# Patient Record
Sex: Female | Born: 1935 | Race: White | Hispanic: No | Smoking: Former smoker
Health system: Southern US, Community
[De-identification: ages and names within clinical notes are randomized; demographics above are authoritative.]

## PROBLEM LIST (undated history)

## (undated) DIAGNOSIS — G459 Transient cerebral ischemic attack, unspecified: Secondary | ICD-10-CM

## (undated) DIAGNOSIS — I493 Ventricular premature depolarization: Secondary | ICD-10-CM

## (undated) DIAGNOSIS — K449 Diaphragmatic hernia without obstruction or gangrene: Secondary | ICD-10-CM

## (undated) DIAGNOSIS — K259 Gastric ulcer, unspecified as acute or chronic, without hemorrhage or perforation: Secondary | ICD-10-CM

## (undated) DIAGNOSIS — N2889 Other specified disorders of kidney and ureter: Secondary | ICD-10-CM

## (undated) DIAGNOSIS — M549 Dorsalgia, unspecified: Secondary | ICD-10-CM

## (undated) DIAGNOSIS — I8500 Esophageal varices without bleeding: Secondary | ICD-10-CM

## (undated) DIAGNOSIS — R918 Other nonspecific abnormal finding of lung field: Secondary | ICD-10-CM

## (undated) DIAGNOSIS — I10 Essential (primary) hypertension: Secondary | ICD-10-CM

## (undated) DIAGNOSIS — C801 Malignant (primary) neoplasm, unspecified: Secondary | ICD-10-CM

## (undated) DIAGNOSIS — E785 Hyperlipidemia, unspecified: Secondary | ICD-10-CM

## (undated) DIAGNOSIS — I2510 Atherosclerotic heart disease of native coronary artery without angina pectoris: Secondary | ICD-10-CM

## (undated) DIAGNOSIS — R9430 Abnormal result of cardiovascular function study, unspecified: Secondary | ICD-10-CM

## (undated) DIAGNOSIS — N814 Uterovaginal prolapse, unspecified: Secondary | ICD-10-CM

## (undated) DIAGNOSIS — R002 Palpitations: Secondary | ICD-10-CM

## (undated) DIAGNOSIS — D649 Anemia, unspecified: Secondary | ICD-10-CM

## (undated) HISTORY — PX: OTHER SURGICAL HISTORY: SHX169

## (undated) HISTORY — DX: Dorsalgia, unspecified: M54.9

## (undated) HISTORY — DX: Abnormal result of cardiovascular function study, unspecified: R94.30

## (undated) HISTORY — PX: BACK SURGERY: SHX140

## (undated) HISTORY — DX: Hyperlipidemia, unspecified: E78.5

## (undated) HISTORY — DX: Ventricular premature depolarization: I49.3

## (undated) HISTORY — PX: TONSILLECTOMY: SUR1361

## (undated) HISTORY — PX: ABDOMINAL HYSTERECTOMY: SHX81

## (undated) HISTORY — DX: Anemia, unspecified: D64.9

## (undated) HISTORY — DX: Atherosclerotic heart disease of native coronary artery without angina pectoris: I25.10

## (undated) HISTORY — DX: Palpitations: R00.2

## (undated) HISTORY — DX: Transient cerebral ischemic attack, unspecified: G45.9

---

## 1999-04-01 ENCOUNTER — Encounter: Payer: Self-pay | Admitting: Neurosurgery

## 1999-04-03 ENCOUNTER — Encounter: Payer: Self-pay | Admitting: Neurosurgery

## 1999-04-03 ENCOUNTER — Inpatient Hospital Stay (HOSPITAL_COMMUNITY): Admission: RE | Admit: 1999-04-03 | Discharge: 1999-04-08 | Payer: Self-pay | Admitting: Neurosurgery

## 1999-07-08 ENCOUNTER — Encounter: Payer: Self-pay | Admitting: Neurosurgery

## 1999-07-08 ENCOUNTER — Encounter: Admission: RE | Admit: 1999-07-08 | Discharge: 1999-07-08 | Payer: Self-pay | Admitting: Neurosurgery

## 1999-10-23 ENCOUNTER — Encounter: Payer: Self-pay | Admitting: Neurosurgery

## 1999-10-23 ENCOUNTER — Ambulatory Visit (HOSPITAL_COMMUNITY): Admission: RE | Admit: 1999-10-23 | Discharge: 1999-10-23 | Payer: Self-pay | Admitting: Neurosurgery

## 1999-10-27 ENCOUNTER — Encounter: Admission: RE | Admit: 1999-10-27 | Discharge: 1999-10-27 | Payer: Self-pay | Admitting: Neurosurgery

## 1999-10-27 ENCOUNTER — Encounter: Payer: Self-pay | Admitting: Neurosurgery

## 2004-04-14 ENCOUNTER — Ambulatory Visit: Payer: Self-pay | Admitting: Internal Medicine

## 2004-04-17 ENCOUNTER — Ambulatory Visit: Payer: Self-pay | Admitting: Cardiology

## 2004-08-25 ENCOUNTER — Inpatient Hospital Stay (HOSPITAL_COMMUNITY): Admission: AD | Admit: 2004-08-25 | Discharge: 2004-08-26 | Payer: Self-pay | Admitting: Cardiology

## 2004-08-25 ENCOUNTER — Ambulatory Visit: Payer: Self-pay | Admitting: Cardiology

## 2004-08-26 ENCOUNTER — Ambulatory Visit: Payer: Self-pay | Admitting: Cardiology

## 2004-09-18 ENCOUNTER — Ambulatory Visit: Payer: Self-pay | Admitting: Cardiology

## 2004-12-15 ENCOUNTER — Ambulatory Visit: Payer: Self-pay | Admitting: Cardiology

## 2005-07-29 ENCOUNTER — Ambulatory Visit: Payer: Self-pay | Admitting: Cardiology

## 2006-03-03 ENCOUNTER — Ambulatory Visit: Payer: Self-pay | Admitting: Cardiology

## 2006-03-10 ENCOUNTER — Ambulatory Visit: Payer: Self-pay | Admitting: Cardiology

## 2006-03-11 ENCOUNTER — Ambulatory Visit: Payer: Self-pay | Admitting: Cardiology

## 2008-06-07 HISTORY — PX: ESOPHAGOGASTRODUODENOSCOPY: SHX1529

## 2009-01-07 HISTORY — PX: ESOPHAGOGASTRODUODENOSCOPY: SHX1529

## 2010-05-19 ENCOUNTER — Telehealth: Payer: Self-pay | Admitting: *Deleted

## 2010-05-27 NOTE — Progress Notes (Signed)
Summary: Funny Feeling in Arms  Phone Note Call from Patient Call back at [facility] Phone [phone]   Summary of Call: Pt states Friday night she had funny spell with arms. She states she doesn't know how to describe it. She states she got weak and had pain between shoulders/neck. She states had another spell on Sunday. She didn't want to go to ER b/c she's scared to go there. Notified pt that ER is most appropriate place for evaluation of these symptoms. Pt verbalized understanding.  Initial call taken by: Cyril Loosen, RN, BSN,  May 19, 2010 9:12 AM

## 2012-03-17 ENCOUNTER — Ambulatory Visit (INDEPENDENT_AMBULATORY_CARE_PROVIDER_SITE_OTHER): Payer: Self-pay | Admitting: Internal Medicine

## 2012-03-22 ENCOUNTER — Encounter (INDEPENDENT_AMBULATORY_CARE_PROVIDER_SITE_OTHER): Payer: Self-pay | Admitting: *Deleted

## 2012-03-22 ENCOUNTER — Ambulatory Visit (INDEPENDENT_AMBULATORY_CARE_PROVIDER_SITE_OTHER): Payer: Medicare Other | Admitting: Internal Medicine

## 2012-03-22 ENCOUNTER — Encounter (INDEPENDENT_AMBULATORY_CARE_PROVIDER_SITE_OTHER): Payer: Self-pay | Admitting: Internal Medicine

## 2012-03-22 ENCOUNTER — Other Ambulatory Visit (INDEPENDENT_AMBULATORY_CARE_PROVIDER_SITE_OTHER): Payer: Self-pay | Admitting: *Deleted

## 2012-03-22 VITALS — BP 134/70 | HR 60 | Temp 97.7°F | Ht 63.0 in | Wt 133.4 lb

## 2012-03-22 DIAGNOSIS — R1310 Dysphagia, unspecified: Secondary | ICD-10-CM

## 2012-03-22 DIAGNOSIS — K2270 Barrett's esophagus without dysplasia: Secondary | ICD-10-CM | POA: Insufficient documentation

## 2012-03-22 DIAGNOSIS — K920 Hematemesis: Secondary | ICD-10-CM | POA: Insufficient documentation

## 2012-03-22 DIAGNOSIS — D649 Anemia, unspecified: Secondary | ICD-10-CM

## 2012-03-22 NOTE — Progress Notes (Signed)
Subjective:     Patient ID: Dorothy Brooks, female   DOB: [birth year], 79 y.o.   MRN: [id number]  HPI Referred to our office by Dr. Sherryll Burger for dysphagia.  She has early satiety.  She feels like foods are lodging in her lower esophagus. Symptoms  X 2 yrs. Appetite is not good. She says she can go all day without eating.  There has not been any weight loss. She has vomited x 2 in the past 2 weeks. No abdominal pain.  She has a BM about every day. No melena or bright red rectal bleeding. Normal size. Granddaughter states that patient vomited a couple of weeks ago and it had blood in it. (coffee ground).  EGD 01/18/2009  by Dr. Karilyn Cota: found to have short segment Barrett's with low grade dysplasia on EGD of April of 2010. At that time she also had extensive esophageal ulceration felt to be due to heaving and retching resulting in upper GI bleed and a large hiatal hernia.  Esiophagitis completely healed. Short segment Barrett's. Large sliding hiatal hernia. Biopsy: Barrett's esophagus showing low grade dysplasia. 02/23/2012 H and H 9.7 and 31.9, MCV 81.  Review of Systems see hpi Current Outpatient Prescriptions  Medication Sig Dispense Refill  . ALPRAZolam (XANAX) 0.5 MG tablet Take 0.5 mg by mouth at bedtime as needed.      . citalopram (CELEXA) 40 MG tablet Take 40 mg by mouth daily.      . ferrous fumarate (HEMOCYTE - 106 MG FE) 325 (106 FE) MG TABS Take 1 tablet by mouth.      . OxyCODONE HCl ER (OXYCONTIN) 30 MG T12A Take by mouth.      . propranolol (INDERAL) 10 MG tablet Take 10 mg by mouth 3 (three) times daily.      Marland Kitchen rOPINIRole (REQUIP) 1 MG tablet Take 1 mg by mouth 3 (three) times daily.       Past Medical History  Diagnosis Date  . Back pain   . High cholesterol   . Anemia    Past Surgical History  Procedure Date  . Back surgery     x 3  . Complete hysterectomy     uterine cancer  . Tonsillectomy   . Left cataract surgery   . Bilateral knee replacement in 2006    Allergies    Allergen Reactions  . Talwin (Pentazocine)         Objective:   Physical Exam  Filed Vitals:   03/22/12 1013  BP: 134/70  Pulse: 60  Temp: 97.7 F (36.5 C)  Height: 5\' 3"  (1.6 m)  Weight: 133 lb 6.4 oz (60.51 kg)   Alert and oriented. Skin warm and dry. Oral mucosa is moist.   . Sclera anicteric, conjunctivae is pink. Thyroid not enlarged. No cervical lymphadenopathy. Lungs clear. Heart regular rate and rhythm.  Abdomen is soft. Bowel sounds are positive. No hepatomegaly. No abdominal masses felt. No tenderness.  No edema to lower extremities.  Stools brown and guaiac negative.     Assessment:   Dysphagia/Hematemesis: Stricture needs to be ruled out. PUD needs to be ruled out also given hx of vomiting coffee ground substance. Surveillance for Barrett's Anemia: PUD needs to be ruled out. Barrett's esophagus: In 2010 revealed low grade dysplasia.     Plan:    EGD/ED with Dr. Karilyn Cota. Patient has declined a colonscopy in the past. Continue Protonix.

## 2012-03-22 NOTE — Patient Instructions (Addendum)
EGD/ED with Dr. Rehman. 

## 2012-03-23 ENCOUNTER — Encounter (HOSPITAL_COMMUNITY): Payer: Self-pay | Admitting: Pharmacy Technician

## 2012-03-30 ENCOUNTER — Ambulatory Visit (HOSPITAL_COMMUNITY)
Admission: RE | Admit: 2012-03-30 | Discharge: 2012-03-30 | Disposition: A | Discharge status: Home / Self Care | Payer: Medicare Other | Source: Ambulatory Visit | Attending: Internal Medicine | Admitting: Internal Medicine

## 2012-03-30 ENCOUNTER — Encounter (HOSPITAL_COMMUNITY)
Admission: RE | Disposition: A | Discharge status: Home / Self Care | Payer: Self-pay | Source: Ambulatory Visit | Attending: Internal Medicine

## 2012-03-30 ENCOUNTER — Encounter (HOSPITAL_COMMUNITY): Payer: Self-pay | Admitting: *Deleted

## 2012-03-30 DIAGNOSIS — K2211 Ulcer of esophagus with bleeding: Secondary | ICD-10-CM

## 2012-03-30 DIAGNOSIS — R112 Nausea with vomiting, unspecified: Secondary | ICD-10-CM | POA: Insufficient documentation

## 2012-03-30 DIAGNOSIS — K449 Diaphragmatic hernia without obstruction or gangrene: Secondary | ICD-10-CM | POA: Insufficient documentation

## 2012-03-30 DIAGNOSIS — K2270 Barrett's esophagus without dysplasia: Secondary | ICD-10-CM

## 2012-03-30 DIAGNOSIS — K219 Gastro-esophageal reflux disease without esophagitis: Secondary | ICD-10-CM

## 2012-03-30 DIAGNOSIS — D649 Anemia, unspecified: Secondary | ICD-10-CM

## 2012-03-30 DIAGNOSIS — R1310 Dysphagia, unspecified: Secondary | ICD-10-CM

## 2012-03-30 DIAGNOSIS — K2970 Gastritis, unspecified, without bleeding: Secondary | ICD-10-CM | POA: Insufficient documentation

## 2012-03-30 HISTORY — DX: Gastric ulcer, unspecified as acute or chronic, without hemorrhage or perforation: K25.9

## 2012-03-30 HISTORY — DX: Malignant (primary) neoplasm, unspecified: C80.1

## 2012-03-30 HISTORY — PX: ESOPHAGOGASTRODUODENOSCOPY (EGD) WITH ESOPHAGEAL DILATION: SHX5812

## 2012-03-30 LAB — CBC
Hemoglobin: 9.8 g/dL — ABNORMAL LOW (ref 12.0–15.0)
MCH: 25.9 pg — ABNORMAL LOW (ref 26.0–34.0)
RBC: 3.78 MIL/uL — ABNORMAL LOW (ref 3.87–5.11)
WBC: 4.6 10*3/uL (ref 4.0–10.5)

## 2012-03-30 LAB — IRON AND TIBC
Iron: 47 ug/dL (ref 42–135)
Saturation Ratios: 13 % — ABNORMAL LOW (ref 20–55)
TIBC: 359 ug/dL (ref 250–470)
UIBC: 312 ug/dL (ref 125–400)

## 2012-03-30 LAB — FERRITIN: Ferritin: 14 ng/mL (ref 10–291)

## 2012-03-30 SURGERY — ESOPHAGOGASTRODUODENOSCOPY (EGD) WITH ESOPHAGEAL DILATION
Anesthesia: Moderate Sedation

## 2012-03-30 MED ORDER — STERILE WATER FOR IRRIGATION IR SOLN
Status: DC | PRN
  Administered 2012-03-30: 11:00:00

## 2012-03-30 MED ORDER — MEPERIDINE HCL 25 MG/ML IJ SOLN
INTRAMUSCULAR | Status: DC | PRN
  Administered 2012-03-30: 25 mg via INTRAVENOUS

## 2012-03-30 MED ORDER — RANITIDINE HCL 150 MG PO TABS: 150.0000 mg | ORAL_TABLET | Freq: Every day | ORAL | Status: DC

## 2012-03-30 MED ORDER — MIDAZOLAM HCL 5 MG/5ML IJ SOLN
INTRAMUSCULAR | Status: DC | PRN
  Administered 2012-03-30: 1 mg via INTRAVENOUS
  Administered 2012-03-30: 2 mg via INTRAVENOUS

## 2012-03-30 MED ORDER — BUTAMBEN-TETRACAINE-BENZOCAINE 2-2-14 % EX AERO
INHALATION_SPRAY | CUTANEOUS | Status: DC | PRN
  Administered 2012-03-30: 2 via TOPICAL

## 2012-03-30 MED ORDER — MIDAZOLAM HCL 5 MG/5ML IJ SOLN
INTRAMUSCULAR | Status: AC
  Filled 2012-03-30: qty 10

## 2012-03-30 MED ORDER — MEPERIDINE HCL 50 MG/ML IJ SOLN
INTRAMUSCULAR | Status: AC
  Filled 2012-03-30: qty 1

## 2012-03-30 MED ORDER — SODIUM CHLORIDE 0.45 % IV SOLN
INTRAVENOUS | Status: DC
  Administered 2012-03-30: 11:00:00 via INTRAVENOUS

## 2012-03-30 NOTE — H&P (Addendum)
Dorothy Brooks is an 79 y.o. female.   Chief Complaint: Patient is here for EGD. HPI: Patient is an 79 year-old Caucasian female with history of GERD complicated by Barrett's esophagus with low-grade dysplasia who presents with recurrent nausea vomiting which occurs primarily when she blows her nose. She also complains of early satiety but denies dysphagia abdominal pain melena or rectal bleeding. On recent evaluation she was found to be anemic. She states she takes naproxen no more than once or twice a month for headache.  Past Medical History  Diagnosis Date  . Back pain   . High cholesterol   . Anemia   . Gastric ulcer   . Cancer     Uterine Cancer    Past Surgical History  Procedure Date  . Back surgery     x 3  . Complete hysterectomy     uterine cancer  . Tonsillectomy   . Left cataract surgery   . Bilateral knee replacement in 2006   . Abdominal hysterectomy     History reviewed. No pertinent family history. Social History:  reports that she has quit smoking. Her smoking use included Cigarettes. She has a 2.5 pack-year smoking history. She does not have any smokeless tobacco history on file. She reports that she does not drink alcohol or use illicit drugs.  Allergies:  Allergies  Allergen Reactions  . Talwin (Pentazocine) Nausea And Vomiting    Deathly to patient.     Medications Prior to Admission  Medication Sig Dispense Refill  . amLODipine (NORVASC) 10 MG tablet Take 10 mg by mouth daily.      . citalopram (CELEXA) 40 MG tablet Take 40 mg by mouth daily.      Marland Kitchen ipratropium-albuterol (DUONEB) 0.5-2.5 (3) MG/3ML SOLN Take 3 mLs by nebulization 3 (three) times daily.      . naproxen sodium (ALEVE) 220 MG tablet Take 220 mg by mouth 2 (two) times daily as needed. Pain      . Oxycodone HCl 20 MG TABS Take 20 mg by mouth 3 (three) times daily.      . pantoprazole (PROTONIX) 40 MG tablet Take 40 mg by mouth daily.      Marland Kitchen rOPINIRole (REQUIP) 1 MG tablet Take 1 mg by  mouth 2 (two) times daily.       Marland Kitchen ALPRAZolam (XANAX) 0.5 MG tablet Take 0.5 mg by mouth at bedtime as needed.        No results found for this or any previous visit (from the past 48 hour(s)). No results found.  ROS  Blood pressure 164/63, pulse 70, temperature 97.7 F (36.5 C), temperature source Oral, resp. rate 21, height 5\' 3"  (1.6 m), weight 133 lb (60.328 kg), SpO2 97.00%. Physical Exam  Constitutional: She appears well-developed and well-nourished.  HENT:  Mouth/Throat: Oropharynx is clear and moist.  Eyes: Conjunctivae normal are normal. No scleral icterus.  Neck: No thyromegaly present.  Cardiovascular: Normal rate, regular rhythm and normal heart sounds.   No murmur heard. Respiratory: Effort normal and breath sounds normal.  GI: Soft. She exhibits no distension and no mass. There is no tenderness.  Musculoskeletal: She exhibits no edema.  Lymphadenopathy:    She has no cervical adenopathy.  Neurological: She is alert.  Skin: Skin is warm and dry.     Assessment/Plan Chronic GERD complicated by Barrett's esophagus with low-grade dysplasia. Persistent symptoms of regurgitation and vomiting. Anemia. Diagnostic EGD.  DorothyNAJEEB Brooks 03/30/2012, 11:23 AM

## 2012-03-30 NOTE — Op Note (Signed)
EGD PROCEDURE REPORT  PATIENT:  Dorothy Brooks  MR#:  [id number] Birthdate:  December 13, 1922, 79 y.o., female Endoscopist:  Dr. Malissa Hippo, MD Referred By:  Dr. Kirstie Peri, MD Procedure Date: 03/30/2012  Procedure:   EGD  Indications:  Patient is a 79 year old Caucasian female with history of complicated who presents with of recurrent spells of nausea and vomiting(most episodes occur when she blows her nose). She was also found to have anemia presumed to be due to iron deficiency. Patient takes OTC NSAIDs on as-needed basis.            Informed Consent:  The risks, benefits, alternatives & imponderables which include, but are not limited to, bleeding, infection, perforation, drug reaction and potential missed lesion have been reviewed.  The potential for biopsy, lesion removal, esophageal dilation, etc. have also been discussed.  Questions have been answered.  All parties agreeable.  Please see history & physical in medical record for more information.  Medications:  Demerol 25 mg IV Versed 3 mg IV Cetacaine spray topically for oropharyngeal anesthesia  Description of procedure:  The endoscope was introduced through the mouth and advanced to the second portion of the duodenum without difficulty or limitations. The mucosal surfaces were surveyed very carefully during advancement of the scope and upon withdrawal.  Findings:  Esophagus:  Mucosa of the proximal and middle segment was normal. Distally there was a single patch of salmon-colored mucosa with ulceration in the proximal end. This patch was about 10-12 mm tall. Biopsy taken on the way out. No stricture was identified. GEJ:  33 cm Hiatus:  39 cm Stomach:  Stomach was empty and distended very well with insufflation. Specks of coffee-ground material noted coating the mucosa at the level of hiatus but no ulcer or AV malformations noted. Single antral scar implying healed ulcer. Pyloric channel was patent. Angularis was unremarkable. Large  hernia was seen on retroflex view as well. Duodenum:  Normal bulbar and post bulbar mucosa.  Therapeutic/Diagnostic Maneuvers Performed:  Biopsy taken from distal esophagus as above.  Complications:  None  Impression: Short segment Barrett's with small distal esophageal ulcer. Large sliding hiatal hernia with focal gastritis at the level of hiatus. Antral scar but no evidence of active PUD.  Recommendations:  Will review old records to make sure she's had H. pylori done otherwise he will need one. Continue pantoprazole as before. Ranitidine 150 mg by mouth each bedtime(OTC). Repeat CBC along with serum iron TIBC and ferritin.    Yohann Curl U  03/30/2012  11:55 AM  CC: Dr. Kirstie Peri, MD & Dr. Bonnetta Barry ref. provider found

## 2012-03-31 ENCOUNTER — Encounter (INDEPENDENT_AMBULATORY_CARE_PROVIDER_SITE_OTHER): Payer: Self-pay

## 2012-04-04 ENCOUNTER — Encounter (HOSPITAL_COMMUNITY): Payer: Self-pay | Admitting: Internal Medicine

## 2012-04-06 ENCOUNTER — Telehealth (INDEPENDENT_AMBULATORY_CARE_PROVIDER_SITE_OTHER): Payer: Self-pay | Admitting: *Deleted

## 2012-04-06 ENCOUNTER — Encounter (INDEPENDENT_AMBULATORY_CARE_PROVIDER_SITE_OTHER): Payer: Self-pay | Admitting: *Deleted

## 2012-04-06 DIAGNOSIS — D649 Anemia, unspecified: Secondary | ICD-10-CM

## 2012-04-06 NOTE — Telephone Encounter (Signed)
Per Dr.Rehman the patient will need to have a CBC in 4 weeks.

## 2012-04-07 ENCOUNTER — Telehealth (INDEPENDENT_AMBULATORY_CARE_PROVIDER_SITE_OTHER): Payer: Self-pay | Admitting: *Deleted

## 2012-04-07 ENCOUNTER — Encounter (INDEPENDENT_AMBULATORY_CARE_PROVIDER_SITE_OTHER): Payer: Self-pay | Admitting: *Deleted

## 2012-04-07 DIAGNOSIS — D649 Anemia, unspecified: Secondary | ICD-10-CM

## 2012-04-07 NOTE — Telephone Encounter (Signed)
Lab order printed

## 2012-04-28 LAB — CBC
MCHC: 32.3 g/dL (ref 30.0–36.0)
Platelets: 283 10*3/uL (ref 150–400)
RDW: 18.5 % — ABNORMAL HIGH (ref 11.5–15.5)

## 2012-10-10 ENCOUNTER — Encounter: Payer: Self-pay | Admitting: Cardiology

## 2012-10-10 DIAGNOSIS — M549 Dorsalgia, unspecified: Secondary | ICD-10-CM | POA: Insufficient documentation

## 2012-10-10 DIAGNOSIS — R9430 Abnormal result of cardiovascular function study, unspecified: Secondary | ICD-10-CM | POA: Insufficient documentation

## 2012-10-10 DIAGNOSIS — Z789 Other specified health status: Secondary | ICD-10-CM | POA: Insufficient documentation

## 2012-10-10 DIAGNOSIS — E785 Hyperlipidemia, unspecified: Secondary | ICD-10-CM | POA: Insufficient documentation

## 2012-10-10 DIAGNOSIS — I2510 Atherosclerotic heart disease of native coronary artery without angina pectoris: Secondary | ICD-10-CM | POA: Insufficient documentation

## 2012-10-10 DIAGNOSIS — G459 Transient cerebral ischemic attack, unspecified: Secondary | ICD-10-CM | POA: Insufficient documentation

## 2012-10-10 DIAGNOSIS — C801 Malignant (primary) neoplasm, unspecified: Secondary | ICD-10-CM | POA: Insufficient documentation

## 2012-10-10 DIAGNOSIS — K259 Gastric ulcer, unspecified as acute or chronic, without hemorrhage or perforation: Secondary | ICD-10-CM | POA: Insufficient documentation

## 2012-10-10 DIAGNOSIS — IMO0002 Reserved for concepts with insufficient information to code with codable children: Secondary | ICD-10-CM | POA: Insufficient documentation

## 2012-10-13 ENCOUNTER — Ambulatory Visit (INDEPENDENT_AMBULATORY_CARE_PROVIDER_SITE_OTHER): Payer: Medicare Other | Admitting: Cardiology

## 2012-10-13 ENCOUNTER — Encounter: Payer: Self-pay | Admitting: Cardiology

## 2012-10-13 VITALS — BP 159/68 | HR 91 | Ht 63.5 in | Wt 136.8 lb

## 2012-10-13 DIAGNOSIS — R002 Palpitations: Secondary | ICD-10-CM | POA: Insufficient documentation

## 2012-10-13 DIAGNOSIS — I493 Ventricular premature depolarization: Secondary | ICD-10-CM

## 2012-10-13 DIAGNOSIS — I4949 Other premature depolarization: Secondary | ICD-10-CM

## 2012-10-13 DIAGNOSIS — I2510 Atherosclerotic heart disease of native coronary artery without angina pectoris: Secondary | ICD-10-CM

## 2012-10-13 NOTE — Patient Instructions (Addendum)

## 2012-10-13 NOTE — Assessment & Plan Note (Signed)
The patient has had some palpitations. I suspect that she senses some of her PVCs. I feel that this is not significant. She has not had syncope or presyncope. I have reassured her that no further workup needs to be done.

## 2012-10-13 NOTE — Progress Notes (Signed)
HPI   The patient is seen today to reestablish her cardiology care. She's had some palpitations. I had seen her in the past but not since 2008. There was no information in our current records. I searched the old electronic medical record and I was able to find data from 2008. In addition I searched the [facility] hospital records for recent information.  I took care of  the patient's husband who passed away approximately 4 years ago. The patient is alert. She does move at home but most of the time she is sitting and relaxing. She is here with her granddaughter today. The patient had had chest discomfort in the past. Cardiac catheterization was done in 2006. There was question at that time that she possibly had a ruptured plaque. Should there was only a 40-50% stenosis. She is not having any significant chest pain. She is anxious. She feels her pulse frequently. She notices some mild irregularity. She has some mild limited palpitations.  Allergies  Allergen Reactions  . Talwin (Pentazocine) Nausea And Vomiting    Deathly to patient.     Current Outpatient Prescriptions  Medication Sig Dispense Refill  . ALPRAZolam (XANAX) 0.5 MG tablet Take 0.25-0.5 mg by mouth at bedtime as needed.       Marland Kitchen amLODipine (NORVASC) 10 MG tablet Take 10 mg by mouth daily.      . citalopram (CELEXA) 40 MG tablet Take 40 mg by mouth daily.      Marland Kitchen docusate sodium (COLACE) 100 MG capsule Take 100 mg by mouth 2 (two) times daily as needed for constipation.      . flintstones complete (FLINTSTONES) 60 MG chewable tablet Chew 1 tablet by mouth daily.      . hydrochlorothiazide (MICROZIDE) 12.5 MG capsule Take 12.5 mg by mouth daily.      Marland Kitchen ipratropium-albuterol (DUONEB) 0.5-2.5 (3) MG/3ML SOLN Take 3 mLs by nebulization every 4 (four) hours as needed.       . Oxycodone HCl 20 MG TABS Take 20 mg by mouth every 4 (four) hours as needed.       . pantoprazole (PROTONIX) 40 MG tablet Take 40 mg by mouth daily.      .  promethazine (PHENERGAN) 25 MG tablet Take 25 mg by mouth every 8 (eight) hours as needed for nausea.      Marland Kitchen rOPINIRole (REQUIP) 1 MG tablet Take 1 mg by mouth 2 (two) times daily.       Marland Kitchen terbinafine (LAMISIL) 250 MG tablet Take 250 mg by mouth daily.       No current facility-administered medications for this visit.    History   Social History  . Marital Status: Married    Spouse Name: N/A    Number of Children: N/A  . Years of Education: N/A   Occupational History  . Not on file.   Social History Main Topics  . Smoking status: Former Smoker -- 0.25 packs/day for 10 years    Types: Cigarettes  . Smokeless tobacco: Not on file  . Alcohol Use: No  . Drug Use: No  . Sexually Active: Not on file   Other Topics Concern  . Not on file   Social History Narrative  . No narrative on file    History reviewed. No pertinent family history.  Past Medical History  Diagnosis Date  . Back pain   . Dyslipidemia   . Anemia   . Gastric ulcer   . Cancer  Uterine Cancer  . CAD (coronary artery disease)   . Ejection fraction     .  Marland Kitchen TIA (transient ischemic attack)   . Palpitations     Past Surgical History  Procedure Laterality Date  . Back surgery      x 3  . Complete hysterectomy      uterine cancer  . Tonsillectomy    . Left cataract surgery    . Bilateral knee replacement in 2006    . Abdominal hysterectomy    . Esophagogastroduodenoscopy (egd) with esophageal dilation  03/30/2012    Procedure: ESOPHAGOGASTRODUODENOSCOPY (EGD) WITH ESOPHAGEAL DILATION;  Surgeon: Malissa Hippo, MD;  Location: [facility];  Service: Endoscopy;  Laterality: N/A;  255-moved to 12:00pm Ann notified pt    Patient Active Problem List   Diagnosis Date Noted  . Palpitations   . Drug intolerance 10/10/2012  . Back pain   . Dyslipidemia   . Gastric ulcer   . Cancer   . CAD (coronary artery disease)   . Ejection fraction   . TIA (transient ischemic attack)   . Hematemesis  03/22/2012  . Anemia 03/22/2012  . Dysphagia 03/22/2012  . Barrett's esophagus 03/22/2012    ROS   Patient denies fever, chills, headache, sweats, rash, change in vision, change in hearing, chest pain, cough, nausea vomiting, urinary symptoms. All other systems are reviewed and are negative.  PHYSICAL EXAM   The patient is maintaining her status well. She is 89. She is oriented to person time and place. Affect is normal. She's here with her granddaughter. There is no jugulovenous distention. There are no carotid bruits. Lungs are clear. Respiratory effort is nonlabored. Cardiac exam reveals S1 and S2. There no clicks or significant murmurs. The abdomen is soft. There is no peripheral edema. There are no musculoskeletal deformities. There are no skin rashes.  Filed Vitals:   10/13/12 1340  BP: 159/68  Pulse: 91  Height: 5' 3.5" (1.613 m)  Weight: 136 lb 12.8 oz (62.052 kg)   EKG is reviewed from a study done in the hospital Aug 06, 2012. There was sinus rhythm. There are nonspecific ST-T wave changes. There was decreased anterior R wave progression. There were scattered PVCs.  ASSESSMENT & PLAN

## 2012-10-13 NOTE — Assessment & Plan Note (Signed)
The patient had a non-ST elevation MI in 2006. This was probably due to ruptured plaque. There was only moderate disease of the vessel. No intervention was done. Nuclear scan in 2008 revealed no ischemia and a normal ejection fraction. She is not having any significant symptoms. No further workup is needed at this time.

## 2012-10-13 NOTE — Assessment & Plan Note (Addendum)
Her PVCs are benign. No further workup is needed.  As part of evaluation today I have spent greater than 45 minutes with her overall care. I've spoken with her at length and spent a great deal of time with her record reviews.

## 2013-06-14 ENCOUNTER — Encounter: Payer: Self-pay | Admitting: Cardiovascular Disease

## 2014-01-02 ENCOUNTER — Encounter: Payer: Self-pay | Admitting: Cardiovascular Disease

## 2014-01-02 ENCOUNTER — Encounter: Payer: Self-pay | Admitting: *Deleted

## 2014-01-02 ENCOUNTER — Ambulatory Visit (INDEPENDENT_AMBULATORY_CARE_PROVIDER_SITE_OTHER): Payer: Medicare Other | Admitting: Cardiovascular Disease

## 2014-01-02 VITALS — BP 147/65 | HR 66 | Ht 63.5 in | Wt 131.0 lb

## 2014-01-02 DIAGNOSIS — I25118 Atherosclerotic heart disease of native coronary artery with other forms of angina pectoris: Secondary | ICD-10-CM

## 2014-01-02 DIAGNOSIS — R002 Palpitations: Secondary | ICD-10-CM

## 2014-01-02 DIAGNOSIS — I491 Atrial premature depolarization: Secondary | ICD-10-CM

## 2014-01-02 DIAGNOSIS — I209 Angina pectoris, unspecified: Secondary | ICD-10-CM

## 2014-01-02 DIAGNOSIS — R079 Chest pain, unspecified: Secondary | ICD-10-CM

## 2014-01-02 DIAGNOSIS — I493 Ventricular premature depolarization: Secondary | ICD-10-CM

## 2014-01-02 MED ORDER — METOPROLOL SUCCINATE ER 25 MG PO TB24: 25.0000 mg | ORAL_TABLET | Freq: Every morning | ORAL | Status: AC

## 2014-01-02 NOTE — Progress Notes (Signed)
Patient ID: Dorothy Brooks, female   DOB: 01/21/1923, 80 y.o.   MRN: [id number]      SUBJECTIVE: The patient is an 80 year old woman with a history of coronary artery disease who has previously been evaluated by Dr. Ron Parker. She reportedly had a non-STEMI in 2006 and underwent coronary angiography which demonstrated moderate coronary artery disease. It was thought that a ruptured plaque was responsible for her heart attack. She also has a history of palpitations and anxiety. She was awoken approximately one week ago at 4 AM with severe chest pain radiating down her left arm accompanied by shortness of breath and palpitations. She has had recurrences of chest pain and palpitations since then but none prior to one week ago. She cannot take antiplatelet agents due to a history of bleeding ulcers. She also has episodic dizziness. She is here with her sister. ECG performed in the office today demonstrates normal sinus rhythm with frequent PAC's and a nonspecific ST segment abnormality in V4. There are no significant changes when compared to an ECG from 2014, other than that ECG had PVC's.   Review of Systems: As per "subjective", otherwise negative.  Allergies  Allergen Reactions  . Talwin [Pentazocine] Nausea And Vomiting    Deathly to patient.     Current Outpatient Prescriptions  Medication Sig Dispense Refill  . ALPRAZolam (XANAX) 0.5 MG tablet Take 0.25-0.5 mg by mouth at bedtime as needed.       Marland Kitchen amLODipine (NORVASC) 10 MG tablet Take 10 mg by mouth daily.      . citalopram (CELEXA) 40 MG tablet Take 40 mg by mouth daily.      Marland Kitchen docusate sodium (COLACE) 100 MG capsule Take 100 mg by mouth 2 (two) times daily as needed for constipation.      Marland Kitchen ipratropium-albuterol (DUONEB) 0.5-2.5 (3) MG/3ML SOLN Take 3 mLs by nebulization every 4 (four) hours as needed.       . iron polysaccharides (NIFEREX) 150 MG capsule Take 150 mg by mouth daily.      . Oxycodone HCl 20 MG TABS Take 20 mg by mouth every 4  (four) hours as needed.       . pantoprazole (PROTONIX) 40 MG tablet Take 40 mg by mouth daily.      Marland Kitchen rOPINIRole (REQUIP) 1 MG tablet Take 1 mg by mouth 2 (two) times daily.        No current facility-administered medications for this visit.    Past Medical History  Diagnosis Date  . Back pain   . Dyslipidemia   . Anemia   . Gastric ulcer   . Cancer     Uterine Cancer  . CAD (coronary artery disease)   . Ejection fraction     .  Marland Kitchen TIA (transient ischemic attack)   . Palpitations   . PVC's (premature ventricular contractions)     Past Surgical History  Procedure Laterality Date  . Back surgery      x 3  . Complete hysterectomy      uterine cancer  . Tonsillectomy    . Left cataract surgery    . Bilateral knee replacement in 2006    . Abdominal hysterectomy    . Esophagogastroduodenoscopy (egd) with esophageal dilation  03/30/2012    Procedure: ESOPHAGOGASTRODUODENOSCOPY (EGD) WITH ESOPHAGEAL DILATION;  Surgeon: Rogene Houston, MD;  Location: [facility];  Service: Endoscopy;  Laterality: N/A;  255-moved to 12:00pm Ann notified pt    History   Social History  .  Marital Status: Married    Spouse Name: N/A    Number of Children: N/A  . Years of Education: N/A   Occupational History  . Not on file.   Social History Main Topics  . Smoking status: Former Smoker -- 0.25 packs/day for 15 years    Types: Cigarettes    Start date: 12/04/1942    Quit date: 12/04/1959  . Smokeless tobacco: Former Systems developer    Types: Snuff, Chew    Quit date: 12/04/1959  . Alcohol Use: No  . Drug Use: No  . Sexual Activity: Not on file   Other Topics Concern  . Not on file   Social History Narrative  . No narrative on file     Filed Vitals:   01/02/14 1027  BP: 147/65  Pulse: 66  Height: 5' 3.5" (1.613 m)  Weight: 131 lb (59.421 kg)    PHYSICAL EXAM General: NAD HEENT: Normal. Neck: No JVD, no thyromegaly. Lungs: Clear to auscultation bilaterally with normal respiratory  effort. CV: Nondisplaced PMI.  Regular rate and rhythm, normal S1/split S2, no S3/S4, 2/6 ejection systolic murmur over RUSB and LUSB. No pretibial or periankle edema.  No carotid bruit.  Normal pedal pulses.  Abdomen: Soft, nontender, no hepatosplenomegaly, no distention.  Neurologic: Alert and oriented x 3.  Psych: Normal affect. Skin: Normal. Musculoskeletal: Normal range of motion, no gross deformities. Extremities: No clubbing or cyanosis.   ECG: Most recent ECG reviewed.   ASSESSMENT AND PLAN: 1. Chest pain in context of CAD: She cannot take aspirin or other antiplatelet therapy due to a history of bleeding ulcers. I will prescribe Toprol-XL 25 mg daily for anti-anginal therapy and to attenuate palpitations. I will also proceed with a Lexi scan Cardiolite stress test to evaluate for ischemia. 2. Essential HTN: Reasonably well-controlled for age on amlodipine 10 mg daily. No changes to therapy. 3. Palpitations/PAC's: Will initiate Toprol-XL 25 mg daily both for palpitations and for anti-anginal therapy.  Dispo: f/u 3 weeks.  Kate Sable, M.D., F.A.C.C.

## 2014-01-02 NOTE — Patient Instructions (Signed)
   Begin Toprol XL 25mg  daily - new sent to pharm Continue all other medications.   Your physician has requested that you have a lexiscan myoview. For further information please visit [email or web address]. Please follow instruction sheet, as given. Office will contact with results via phone or letter.   Follow up in  3 weeks

## 2014-01-02 NOTE — Addendum Note (Signed)
Addended by: Laurine Blazer on: 01/02/2014 11:10 AM   Modules accepted: Orders

## 2014-01-11 ENCOUNTER — Encounter (HOSPITAL_COMMUNITY)
Admission: RE | Admit: 2014-01-11 | Discharge: 2014-01-11 | Disposition: A | Discharge status: Home / Self Care | Payer: Medicare Other | Source: Ambulatory Visit | Attending: Cardiovascular Disease | Admitting: Cardiovascular Disease

## 2014-01-11 ENCOUNTER — Encounter (HOSPITAL_COMMUNITY): Payer: Self-pay

## 2014-01-11 ENCOUNTER — Ambulatory Visit (HOSPITAL_COMMUNITY)
Admission: RE | Admit: 2014-01-11 | Discharge: 2014-01-11 | Disposition: A | Discharge status: Home / Self Care | Payer: Medicare Other | Source: Ambulatory Visit | Attending: Cardiovascular Disease | Admitting: Cardiovascular Disease

## 2014-01-11 DIAGNOSIS — I2510 Atherosclerotic heart disease of native coronary artery without angina pectoris: Secondary | ICD-10-CM | POA: Diagnosis not present

## 2014-01-11 DIAGNOSIS — I25118 Atherosclerotic heart disease of native coronary artery with other forms of angina pectoris: Secondary | ICD-10-CM | POA: Insufficient documentation

## 2014-01-11 DIAGNOSIS — R079 Chest pain, unspecified: Secondary | ICD-10-CM | POA: Insufficient documentation

## 2014-01-11 DIAGNOSIS — R002 Palpitations: Secondary | ICD-10-CM | POA: Insufficient documentation

## 2014-01-11 HISTORY — DX: Essential (primary) hypertension: I10

## 2014-01-11 MED ORDER — SODIUM CHLORIDE 0.9 % IJ SOLN
10.0000 mL | INTRAMUSCULAR | Status: DC | PRN
  Administered 2014-01-11: 10 mL via INTRAVENOUS
  Filled 2014-01-11: qty 10

## 2014-01-11 MED ORDER — TECHNETIUM TC 99M SESTAMIBI - CARDIOLITE
30.0000 | Freq: Once | INTRAVENOUS | Status: AC | PRN
  Administered 2014-01-11: 12:00:00 30 via INTRAVENOUS

## 2014-01-11 MED ORDER — TECHNETIUM TC 99M SESTAMIBI GENERIC - CARDIOLITE
10.0000 | Freq: Once | INTRAVENOUS | Status: AC | PRN
  Administered 2014-01-11: 10 via INTRAVENOUS

## 2014-01-11 MED ORDER — REGADENOSON 0.4 MG/5ML IV SOLN
0.4000 mg | Freq: Once | INTRAVENOUS | Status: AC
  Administered 2014-01-11: 0.4 mg via INTRAVENOUS

## 2014-01-11 NOTE — Progress Notes (Signed)
Stress Lab Nurses Notes - [facility] Dorothy Brooks 01/11/2014 Reason for doing test: CAD, chest pain & palpitation Type of test: Wille Glaser Nurse performing test: Gerrit Halls, RN Nuclear Medicine Tech: Melburn Hake Echo Tech: Not Applicable MD performing test: Branch/K.Purcell Nails NP Family MD: Manuella Ghazi Test explained and consent signed: Yes.   IV started: Saline lock flushed, No redness or edema and Saline lock started in radiology Symptoms: SOB & headache Treatment/Intervention: None Reason test stopped: protocol completed After recovery IV was: Discontinued via X-ray tech and No redness or edema Patient to return to Nuc. Med at : 12:20 Patient discharged: Home Patient's Condition upon discharge was: stable Comments:  During test BP 130/68 & HR 86.  Recovery BP 133/64 & HR 76.  Symptoms resolved in recovery.   [name] T

## 2014-01-12 ENCOUNTER — Telehealth: Payer: Self-pay | Admitting: *Deleted

## 2014-01-12 NOTE — Telephone Encounter (Signed)
Notes Recorded by Laurine Blazer, LPN on 35/07/9739 at 63:84 AM Patient notified. Already has f/u scheduled for 01/15/14 with Dr. Bronson Ing.

## 2014-01-12 NOTE — Telephone Encounter (Signed)
-----   Message from Herminio Commons, MD sent at 01/12/2014 10:09 AM EST ----- Normal.

## 2014-01-15 ENCOUNTER — Ambulatory Visit (INDEPENDENT_AMBULATORY_CARE_PROVIDER_SITE_OTHER): Payer: Medicare Other | Admitting: Cardiovascular Disease

## 2014-01-15 ENCOUNTER — Encounter: Payer: Self-pay | Admitting: Cardiovascular Disease

## 2014-01-15 VITALS — BP 175/77 | HR 78 | Ht 64.0 in | Wt 131.0 lb

## 2014-01-15 DIAGNOSIS — IMO0001 Reserved for inherently not codable concepts without codable children: Secondary | ICD-10-CM

## 2014-01-15 DIAGNOSIS — Z136 Encounter for screening for cardiovascular disorders: Secondary | ICD-10-CM

## 2014-01-15 DIAGNOSIS — I25118 Atherosclerotic heart disease of native coronary artery with other forms of angina pectoris: Secondary | ICD-10-CM

## 2014-01-15 DIAGNOSIS — I10 Essential (primary) hypertension: Secondary | ICD-10-CM

## 2014-01-15 DIAGNOSIS — R079 Chest pain, unspecified: Secondary | ICD-10-CM

## 2014-01-15 DIAGNOSIS — R002 Palpitations: Secondary | ICD-10-CM

## 2014-01-15 MED ORDER — NITROGLYCERIN 0.4 MG SL SUBL: 0.4000 mg | SUBLINGUAL_TABLET | SUBLINGUAL | Status: AC | PRN

## 2014-01-15 NOTE — Patient Instructions (Signed)
   Begin Nitroglycerin as needed for severe chest pain only - new sent to pharm  Please take your blood pressure medications DAILY  Continue all other current medications. Follow up in  3-4 months

## 2014-01-15 NOTE — Progress Notes (Signed)
Patient ID: Dorothy Brooks, female   DOB: 01/20/1923, 80 y.o.   MRN: [id number]      SUBJECTIVE: The patient is here to follow-up on the results of cardiovascular testing performed for the evaluation of chest pain. Nuclear stress testing was normal with no evidence of myocardial ischemia or scar, with normal left ventricular systolic function and regional wall motion. She forgot to take her amlodipine for the past two days. She has had some mild chest discomfort on two occasions but nothing severe.   Review of Systems: As per "subjective", otherwise negative.  Allergies  Allergen Reactions  . Talwin [Pentazocine] Nausea And Vomiting    Deathly to patient.     Current Outpatient Prescriptions  Medication Sig Dispense Refill  . ALPRAZolam (XANAX) 0.5 MG tablet Take 0.25-0.5 mg by mouth at bedtime as needed.     Marland Kitchen amLODipine (NORVASC) 10 MG tablet Take 10 mg by mouth daily.    . citalopram (CELEXA) 40 MG tablet Take 40 mg by mouth daily.    Marland Kitchen docusate sodium (COLACE) 100 MG capsule Take 100 mg by mouth 2 (two) times daily as needed for constipation.    Marland Kitchen ipratropium-albuterol (DUONEB) 0.5-2.5 (3) MG/3ML SOLN Take 3 mLs by nebulization every 4 (four) hours as needed.     . iron polysaccharides (NIFEREX) 150 MG capsule Take 150 mg by mouth daily.    . metoprolol succinate (TOPROL XL) 25 MG 24 hr tablet Take 1 tablet (25 mg total) by mouth every morning. 30 tablet 6  . Oxycodone HCl 20 MG TABS Take 20 mg by mouth every 4 (four) hours as needed.     . pantoprazole (PROTONIX) 40 MG tablet Take 40 mg by mouth daily.    Marland Kitchen rOPINIRole (REQUIP) 1 MG tablet Take 1 mg by mouth 2 (two) times daily.      No current facility-administered medications for this visit.    Past Medical History  Diagnosis Date  . Back pain   . Dyslipidemia   . Anemia   . Gastric ulcer   . Cancer     Uterine Cancer  . CAD (coronary artery disease)   . Ejection fraction     .  Marland Kitchen TIA (transient ischemic attack)   .  Palpitations   . PVC's (premature ventricular contractions)   . Hypertension     Past Surgical History  Procedure Laterality Date  . Back surgery      x 3  . Complete hysterectomy      uterine cancer  . Tonsillectomy    . Left cataract surgery    . Bilateral knee replacement in 2006    . Abdominal hysterectomy    . Esophagogastroduodenoscopy (egd) with esophageal dilation  03/30/2012    Procedure: ESOPHAGOGASTRODUODENOSCOPY (EGD) WITH ESOPHAGEAL DILATION;  Surgeon: Rogene Houston, MD;  Location: [facility];  Service: Endoscopy;  Laterality: N/A;  255-moved to 12:00pm Ann notified pt    History   Social History  . Marital Status: Widowed    Spouse Name: N/A    Number of Children: N/A  . Years of Education: N/A   Occupational History  . Not on file.   Social History Main Topics  . Smoking status: Former Smoker -- 0.25 packs/day for 15 years    Types: Cigarettes    Start date: 12/04/1942    Quit date: 12/04/1959  . Smokeless tobacco: Former Systems developer    Types: Snuff, Chew    Quit date: 12/04/1959  . Alcohol Use:  No  . Drug Use: No  . Sexual Activity: Not on file   Other Topics Concern  . Not on file   Social History Narrative     Filed Vitals:   01/15/14 1348  BP: 175/77  Pulse: 78  Height: 5\' 4"  (1.626 m)  Weight: 131 lb (59.421 kg)  SpO2: 98%    PHYSICAL EXAM General: NAD HEENT: Normal. Neck: No JVD, no thyromegaly. Lungs: Clear to auscultation bilaterally with normal respiratory effort. CV: Nondisplaced PMI.  Regular rate and rhythm, normal S1/S2, no S3/S4, no murmur. No pretibial or periankle edema.  No carotid bruit.  Normal pedal pulses.  Abdomen: Soft, nontender, no hepatosplenomegaly, no distention.  Neurologic: Alert and oriented x 3.  Psych: Normal affect. Skin: Normal. Musculoskeletal: Normal range of motion, no gross deformities. Extremities: No clubbing or cyanosis.   ECG: Most recent ECG reviewed.      ASSESSMENT AND PLAN: 1.  Chest pain in context of CAD: Normal nuclear stress test. She cannot take aspirin or other antiplatelet therapy due to a history of bleeding ulcers. I will continue Toprol-XL 25 mg daily for anti-anginal therapy and to attenuate palpitations. I will also give her a prescription for SL nitro. 2. Essential HTN: Markedly elevated but forgot to take amlodipine 10 mg. I have informed her about the importance of taking her medications as prescribed in a timely fashion. She does have a pill box. 3. Palpitations/PAC's: Will continueToprol-XL 25 mg daily both for palpitations and for anti-anginal therapy.  Dispo: f/u 3-4 months.   Kate Sable, M.D., F.A.C.C.

## 2014-01-22 ENCOUNTER — Ambulatory Visit: Payer: Medicare Other | Admitting: Cardiology

## 2014-02-18 ENCOUNTER — Inpatient Hospital Stay (HOSPITAL_COMMUNITY)
Admission: EM | Admit: 2014-02-18 | Discharge: 2014-02-21 | DRG: 377 | Disposition: A | Discharge status: Home / Self Care | Payer: Medicare Other | Attending: Internal Medicine | Admitting: Internal Medicine

## 2014-02-18 ENCOUNTER — Encounter (HOSPITAL_COMMUNITY): Payer: Self-pay | Admitting: Emergency Medicine

## 2014-02-18 DIAGNOSIS — Z882 Allergy status to sulfonamides status: Secondary | ICD-10-CM

## 2014-02-18 DIAGNOSIS — Z96653 Presence of artificial knee joint, bilateral: Secondary | ICD-10-CM | POA: Diagnosis present

## 2014-02-18 DIAGNOSIS — Z886 Allergy status to analgesic agent status: Secondary | ICD-10-CM | POA: Diagnosis not present

## 2014-02-18 DIAGNOSIS — G9340 Encephalopathy, unspecified: Secondary | ICD-10-CM | POA: Diagnosis present

## 2014-02-18 DIAGNOSIS — R1030 Lower abdominal pain, unspecified: Secondary | ICD-10-CM | POA: Diagnosis present

## 2014-02-18 DIAGNOSIS — F17220 Nicotine dependence, chewing tobacco, uncomplicated: Secondary | ICD-10-CM | POA: Diagnosis present

## 2014-02-18 DIAGNOSIS — K922 Gastrointestinal hemorrhage, unspecified: Secondary | ICD-10-CM | POA: Diagnosis present

## 2014-02-18 DIAGNOSIS — R220 Localized swelling, mass and lump, head: Secondary | ICD-10-CM | POA: Diagnosis present

## 2014-02-18 DIAGNOSIS — N179 Acute kidney failure, unspecified: Secondary | ICD-10-CM | POA: Diagnosis present

## 2014-02-18 DIAGNOSIS — E785 Hyperlipidemia, unspecified: Secondary | ICD-10-CM | POA: Diagnosis present

## 2014-02-18 DIAGNOSIS — R0602 Shortness of breath: Secondary | ICD-10-CM

## 2014-02-18 DIAGNOSIS — K2270 Barrett's esophagus without dysplasia: Secondary | ICD-10-CM | POA: Diagnosis present

## 2014-02-18 DIAGNOSIS — K279 Peptic ulcer, site unspecified, unspecified as acute or chronic, without hemorrhage or perforation: Secondary | ICD-10-CM | POA: Diagnosis present

## 2014-02-18 DIAGNOSIS — Z8542 Personal history of malignant neoplasm of other parts of uterus: Secondary | ICD-10-CM

## 2014-02-18 DIAGNOSIS — R410 Disorientation, unspecified: Secondary | ICD-10-CM

## 2014-02-18 DIAGNOSIS — N390 Urinary tract infection, site not specified: Secondary | ICD-10-CM | POA: Diagnosis present

## 2014-02-18 DIAGNOSIS — D62 Acute posthemorrhagic anemia: Secondary | ICD-10-CM | POA: Diagnosis present

## 2014-02-18 DIAGNOSIS — Z66 Do not resuscitate: Secondary | ICD-10-CM | POA: Diagnosis present

## 2014-02-18 DIAGNOSIS — I2510 Atherosclerotic heart disease of native coronary artery without angina pectoris: Secondary | ICD-10-CM | POA: Diagnosis present

## 2014-02-18 DIAGNOSIS — R0902 Hypoxemia: Secondary | ICD-10-CM

## 2014-02-18 DIAGNOSIS — G253 Myoclonus: Secondary | ICD-10-CM | POA: Diagnosis present

## 2014-02-18 DIAGNOSIS — R918 Other nonspecific abnormal finding of lung field: Secondary | ICD-10-CM | POA: Diagnosis present

## 2014-02-18 DIAGNOSIS — Z8673 Personal history of transient ischemic attack (TIA), and cerebral infarction without residual deficits: Secondary | ICD-10-CM

## 2014-02-18 DIAGNOSIS — R102 Pelvic and perineal pain: Secondary | ICD-10-CM | POA: Insufficient documentation

## 2014-02-18 DIAGNOSIS — I10 Essential (primary) hypertension: Secondary | ICD-10-CM | POA: Diagnosis present

## 2014-02-18 DIAGNOSIS — N281 Cyst of kidney, acquired: Secondary | ICD-10-CM | POA: Diagnosis present

## 2014-02-18 DIAGNOSIS — J9601 Acute respiratory failure with hypoxia: Secondary | ICD-10-CM | POA: Diagnosis present

## 2014-02-18 DIAGNOSIS — N2889 Other specified disorders of kidney and ureter: Secondary | ICD-10-CM | POA: Diagnosis present

## 2014-02-18 DIAGNOSIS — R4182 Altered mental status, unspecified: Secondary | ICD-10-CM

## 2014-02-18 DIAGNOSIS — G8929 Other chronic pain: Secondary | ICD-10-CM | POA: Diagnosis present

## 2014-02-18 HISTORY — DX: Uterovaginal prolapse, unspecified: N81.4

## 2014-02-18 HISTORY — DX: Other nonspecific abnormal finding of lung field: R91.8

## 2014-02-18 HISTORY — DX: Other specified disorders of kidney and ureter: N28.89

## 2014-02-18 HISTORY — DX: Diaphragmatic hernia without obstruction or gangrene: K44.9

## 2014-02-18 HISTORY — DX: Esophageal varices without bleeding: I85.00

## 2014-02-18 LAB — COMPREHENSIVE METABOLIC PANEL
ALT: 9 U/L (ref 0–35)
AST: 18 U/L (ref 0–37)
Albumin: 4 g/dL (ref 3.5–5.2)
Alkaline Phosphatase: 90 U/L (ref 39–117)
Anion gap: 15 (ref 5–15)
BUN: 29 mg/dL — ABNORMAL HIGH (ref 6–23)
CALCIUM: 9.3 mg/dL (ref 8.4–10.5)
CO2: 27 mEq/L (ref 19–32)
CREATININE: 3.44 mg/dL — AB (ref 0.50–1.10)
Chloride: 95 mEq/L — ABNORMAL LOW (ref 96–112)
GFR calc Af Amer: 13 mL/min — ABNORMAL LOW (ref >90)
GFR calc non Af Amer: 11 mL/min — ABNORMAL LOW (ref >90)
Glucose, Bld: 143 mg/dL — ABNORMAL HIGH (ref 70–99)
Potassium: 3.9 mEq/L (ref 3.7–5.3)
SODIUM: 137 meq/L (ref 137–147)
TOTAL PROTEIN: 7.7 g/dL (ref 6.0–8.3)
Total Bilirubin: 0.4 mg/dL (ref 0.3–1.2)

## 2014-02-18 LAB — URINALYSIS, ROUTINE W REFLEX MICROSCOPIC
GLUCOSE, UA: NEGATIVE mg/dL
KETONES UR: NEGATIVE mg/dL
Nitrite: NEGATIVE
PH: 7 (ref 5.0–8.0)
Protein, ur: 100 mg/dL — AB
Specific Gravity, Urine: 1.015 (ref 1.005–1.030)
Urobilinogen, UA: 0.2 mg/dL (ref 0.0–1.0)

## 2014-02-18 LAB — OCCULT BLOOD GASTRIC / DUODENUM (SPECIMEN CUP)
Occult Blood, Gastric: POSITIVE — AB
pH, Gastric: 4

## 2014-02-18 LAB — CBC WITH DIFFERENTIAL/PLATELET
BASOS PCT: 1 % (ref 0–1)
Basophils Absolute: 0.1 10*3/uL (ref 0.0–0.1)
Eosinophils Absolute: 0.2 10*3/uL (ref 0.0–0.7)
Eosinophils Relative: 2 % (ref 0–5)
HCT: 27.4 % — ABNORMAL LOW (ref 36.0–46.0)
Hemoglobin: 8.2 g/dL — ABNORMAL LOW (ref 12.0–15.0)
Lymphocytes Relative: 24 % (ref 12–46)
Lymphs Abs: 1.8 10*3/uL (ref 0.7–4.0)
MCH: 22.5 pg — AB (ref 26.0–34.0)
MCHC: 29.9 g/dL — AB (ref 30.0–36.0)
MCV: 75.3 fL — AB (ref 78.0–100.0)
Monocytes Absolute: 1 10*3/uL (ref 0.1–1.0)
Monocytes Relative: 13 % — ABNORMAL HIGH (ref 3–12)
Neutro Abs: 4.7 10*3/uL (ref 1.7–7.7)
Neutrophils Relative %: 60 % (ref 43–77)
PLATELETS: 304 10*3/uL (ref 150–400)
RBC: 3.64 MIL/uL — ABNORMAL LOW (ref 3.87–5.11)
RDW: 17.5 % — AB (ref 11.5–15.5)
WBC: 7.6 10*3/uL (ref 4.0–10.5)

## 2014-02-18 LAB — LIPASE, BLOOD: LIPASE: 49 U/L (ref 11–59)

## 2014-02-18 LAB — URINE MICROSCOPIC-ADD ON

## 2014-02-18 MED ORDER — INFLUENZA VAC SPLIT QUAD 0.5 ML IM SUSY
0.5000 mL | PREFILLED_SYRINGE | INTRAMUSCULAR | Status: DC
  Filled 2014-02-18: qty 0.5

## 2014-02-18 MED ORDER — NITROGLYCERIN 0.4 MG SL SUBL: 0.4000 mg | SUBLINGUAL_TABLET | SUBLINGUAL | Status: DC | PRN

## 2014-02-18 MED ORDER — SODIUM CHLORIDE 0.9 % IV BOLUS (SEPSIS)
1000.0000 mL | Freq: Once | INTRAVENOUS | Status: AC
  Administered 2014-02-18: 1000 mL via INTRAVENOUS

## 2014-02-18 MED ORDER — ACETAMINOPHEN 325 MG PO TABS
650.0000 mg | ORAL_TABLET | Freq: Four times a day (QID) | ORAL | Status: DC | PRN
  Filled 2014-02-18: qty 2

## 2014-02-18 MED ORDER — MORPHINE SULFATE 4 MG/ML IJ SOLN
4.0000 mg | Freq: Once | INTRAMUSCULAR | Status: AC
  Administered 2014-02-18: 4 mg via INTRAVENOUS
  Filled 2014-02-18: qty 1

## 2014-02-18 MED ORDER — ROPINIROLE HCL 1 MG PO TABS
ORAL_TABLET | ORAL | Status: AC
  Filled 2014-02-18: qty 1

## 2014-02-18 MED ORDER — ONDANSETRON HCL 4 MG PO TABS: 4.0000 mg | ORAL_TABLET | Freq: Four times a day (QID) | ORAL | Status: DC | PRN

## 2014-02-18 MED ORDER — PANTOPRAZOLE SODIUM 40 MG IV SOLR: 40.0000 mg | Freq: Two times a day (BID) | INTRAVENOUS | Status: DC

## 2014-02-18 MED ORDER — SODIUM CHLORIDE 0.9 % IV SOLN
INTRAVENOUS | Status: DC
  Administered 2014-02-18 – 2014-02-21 (×5): via INTRAVENOUS

## 2014-02-18 MED ORDER — ALUM & MAG HYDROXIDE-SIMETH 200-200-20 MG/5ML PO SUSP: 30.0000 mL | Freq: Four times a day (QID) | ORAL | Status: DC | PRN

## 2014-02-18 MED ORDER — METOPROLOL SUCCINATE ER 25 MG PO TB24: 25.0000 mg | ORAL_TABLET | Freq: Every morning | ORAL | Status: DC

## 2014-02-18 MED ORDER — ROPINIROLE HCL 1 MG PO TABS
1.0000 mg | ORAL_TABLET | Freq: Two times a day (BID) | ORAL | Status: DC
  Administered 2014-02-18 – 2014-02-21 (×5): 1 mg via ORAL
  Filled 2014-02-18 (×10): qty 1

## 2014-02-18 MED ORDER — OXYCODONE HCL 5 MG PO TABS
20.0000 mg | ORAL_TABLET | ORAL | Status: DC | PRN
  Administered 2014-02-18 – 2014-02-21 (×11): 20 mg via ORAL
  Filled 2014-02-18 (×11): qty 4

## 2014-02-18 MED ORDER — IPRATROPIUM-ALBUTEROL 0.5-2.5 (3) MG/3ML IN SOLN: 3.0000 mL | RESPIRATORY_TRACT | Status: DC | PRN

## 2014-02-18 MED ORDER — PANTOPRAZOLE SODIUM 40 MG IV SOLR
80.0000 mg | Freq: Once | INTRAVENOUS | Status: AC
  Administered 2014-02-18: 80 mg via INTRAVENOUS
  Filled 2014-02-18: qty 80

## 2014-02-18 MED ORDER — ALPRAZOLAM 0.25 MG PO TABS
0.2500 mg | ORAL_TABLET | Freq: Every evening | ORAL | Status: DC | PRN
  Administered 2014-02-18 – 2014-02-20 (×2): 0.5 mg via ORAL
  Filled 2014-02-18 (×3): qty 2

## 2014-02-18 MED ORDER — DOCUSATE SODIUM 100 MG PO CAPS: 100.0000 mg | ORAL_CAPSULE | Freq: Two times a day (BID) | ORAL | Status: DC | PRN

## 2014-02-18 MED ORDER — PANTOPRAZOLE SODIUM 40 MG IV SOLR
8.0000 mg/h | INTRAVENOUS | Status: DC
  Administered 2014-02-18 – 2014-02-20 (×3): 8 mg/h via INTRAVENOUS
  Filled 2014-02-18 (×7): qty 80

## 2014-02-18 MED ORDER — PANTOPRAZOLE SODIUM 40 MG IV SOLR
INTRAVENOUS | Status: AC
  Filled 2014-02-18: qty 160

## 2014-02-18 MED ORDER — ACETAMINOPHEN 650 MG RE SUPP
650.0000 mg | Freq: Four times a day (QID) | RECTAL | Status: DC | PRN
Start: 2014-02-18 — End: 2014-02-21

## 2014-02-18 MED ORDER — DEXTROSE 5 % IV SOLN
1.0000 g | INTRAVENOUS | Status: DC
  Administered 2014-02-18: 1 g via INTRAVENOUS
  Filled 2014-02-18: qty 10

## 2014-02-18 MED ORDER — ONDANSETRON HCL 4 MG/2ML IJ SOLN
4.0000 mg | Freq: Once | INTRAMUSCULAR | Status: AC
  Administered 2014-02-18: 4 mg via INTRAVENOUS
  Filled 2014-02-18: qty 2

## 2014-02-18 MED ORDER — DOCUSATE SODIUM 100 MG PO CAPS: 100.0000 mg | ORAL_CAPSULE | Freq: Every day | ORAL | Status: DC | PRN

## 2014-02-18 MED ORDER — CITALOPRAM HYDROBROMIDE 20 MG PO TABS
40.0000 mg | ORAL_TABLET | Freq: Every day | ORAL | Status: DC
  Administered 2014-02-18 – 2014-02-19 (×2): 40 mg via ORAL
  Filled 2014-02-18 (×2): qty 2

## 2014-02-18 MED ORDER — ONDANSETRON HCL 4 MG/2ML IJ SOLN: 4.0000 mg | Freq: Four times a day (QID) | INTRAMUSCULAR | Status: DC | PRN

## 2014-02-18 MED ORDER — AMLODIPINE BESYLATE 5 MG PO TABS
10.0000 mg | ORAL_TABLET | Freq: Every day | ORAL | Status: DC
  Administered 2014-02-18: 10 mg via ORAL
  Filled 2014-02-18: qty 2

## 2014-02-18 NOTE — ED Provider Notes (Signed)
CSN: [id number]     Arrival date & time 02/18/14  1639 History   First MD Initiated Contact with Patient 02/18/14 1648     Chief Complaint  Patient presents with  . Hematemesis      HPI Patient presents to the emergency department complaining of hematemesis over the past 2 days.  Patient has a history of hiatal hernia and prior upper GI bleed.  Patient underwent endoscopy in 2014 which demonstrated a small Barrett's esophagus with ulcer distally as well as associated gastritis a sliding hiatal hernia.  She's been compliant with her Protonix.  She reports dark stools.  No significant lightheadedness.  Patient has required blood transfusions before.  Family is concerned because she's continued to have nausea vomiting over the past 48 hours.  No diarrhea.  Symptoms are mild to moderate in severity.   Past Medical History  Diagnosis Date  . Back pain   . Dyslipidemia   . Anemia   . Gastric ulcer   . Cancer     Uterine Cancer  . CAD (coronary artery disease)   . Ejection fraction     .  Marland Kitchen TIA (transient ischemic attack)   . Palpitations   . PVC's (premature ventricular contractions)   . Hypertension   . Hiatal hernia   . Varices, esophageal   . Prolapse of uterus    Past Surgical History  Procedure Laterality Date  . Back surgery      x 3  . Complete hysterectomy      uterine cancer  . Tonsillectomy    . Left cataract surgery    . Bilateral knee replacement in 2006    . Abdominal hysterectomy    . Esophagogastroduodenoscopy (egd) with esophageal dilation  03/30/2012    Procedure: ESOPHAGOGASTRODUODENOSCOPY (EGD) WITH ESOPHAGEAL DILATION;  Surgeon: Rogene Houston, MD;  Location: [facility];  Service: Endoscopy;  Laterality: N/A;  255-moved to 12:00pm Ann notified pt   History reviewed. No pertinent family history. History  Substance Use Topics  . Smoking status: Former Smoker -- 0.25 packs/day for 15 years    Types: Cigarettes    Start date: 12/04/1942    Quit date:  12/04/1959  . Smokeless tobacco: Former Systems developer    Types: Snuff, Chew    Quit date: 12/04/1959  . Alcohol Use: No   OB History    Gravida Para Term Preterm AB TAB SAB Ectopic Multiple Living   2 2 2       2      Review of Systems  All other systems reviewed and are negative.     Allergies  Aspirin; Sulfa antibiotics; and Talwin  Home Medications   Prior to Admission medications   Medication Sig Start Date End Date Taking? Authorizing Provider  ALPRAZolam Duanne Moron) 0.5 MG tablet Take 0.25-0.5 mg by mouth at bedtime as needed for anxiety or sleep.    Yes Historical Provider, MD  amLODipine (NORVASC) 10 MG tablet Take 10 mg by mouth daily.   Yes Historical Provider, MD  citalopram (CELEXA) 40 MG tablet Take 40 mg by mouth daily.   Yes Historical Provider, MD  docusate sodium (COLACE) 100 MG capsule Take 100 mg by mouth 2 (two) times daily as needed for constipation.   Yes Historical Provider, MD  ipratropium-albuterol (DUONEB) 0.5-2.5 (3) MG/3ML SOLN Take 3 mLs by nebulization every 4 (four) hours as needed (for shortness of breath/wheezing).    Yes Historical Provider, MD  nitroGLYCERIN (NITROSTAT) 0.4 MG SL tablet Place 1  tablet (0.4 mg total) under the tongue every 5 (five) minutes as needed for chest pain. 01/15/14  Yes Herminio Commons, MD  Oxycodone HCl 20 MG TABS Take 20 mg by mouth every 4 (four) hours as needed (for pain).    Yes Historical Provider, MD  pantoprazole (PROTONIX) 40 MG tablet Take 40 mg by mouth daily.   Yes Historical Provider, MD  rOPINIRole (REQUIP) 1 MG tablet Take 1 mg by mouth 2 (two) times daily.    Yes Historical Provider, MD  iron polysaccharides (NIFEREX) 150 MG capsule Take 150 mg by mouth daily.    Historical Provider, MD  metoprolol succinate (TOPROL XL) 25 MG 24 hr tablet Take 1 tablet (25 mg total) by mouth every morning. 01/02/14   Herminio Commons, MD   BP 100/69 mmHg  Pulse 90  Temp(Src) 98.2 F (36.8 C) (Oral)  Resp 18  Ht 5\' 3"  (1.6 m)   Wt 130 lb (58.968 kg)  BMI 23.03 kg/m2  SpO2 91% Physical Exam  Constitutional: She is oriented to person, place, and time. She appears well-developed and well-nourished. No distress.  HENT:  Head: Normocephalic and atraumatic.  Eyes: EOM are normal.  Neck: Normal range of motion.  Cardiovascular: Normal rate, regular rhythm and normal heart sounds.   Pulmonary/Chest: Effort normal and breath sounds normal.  Abdominal: Soft. She exhibits no distension. There is no tenderness.  Genitourinary:  Brown stool.  No masses.  Hemoccult negative.  Musculoskeletal: Normal range of motion.  Neurological: She is alert and oriented to person, place, and time.  Skin: Skin is warm and dry.  Psychiatric: She has a normal mood and affect. Judgment normal.  Nursing note and vitals reviewed.   ED Course  Procedures (including critical care time) Labs Review Labs Reviewed  CBC WITH DIFFERENTIAL - Abnormal; Notable for the following:    RBC 3.64 (*)    Hemoglobin 8.2 (*)    HCT 27.4 (*)    MCV 75.3 (*)    MCH 22.5 (*)    MCHC 29.9 (*)    RDW 17.5 (*)    Monocytes Relative 13 (*)    All other components within normal limits  COMPREHENSIVE METABOLIC PANEL - Abnormal; Notable for the following:    Chloride 95 (*)    Glucose, Bld 143 (*)    BUN 29 (*)    Creatinine, Ser 3.44 (*)    GFR calc non Af Amer 11 (*)    GFR calc Af Amer 13 (*)    All other components within normal limits  LIPASE, BLOOD  URINALYSIS, ROUTINE W REFLEX MICROSCOPIC  TYPE AND SCREEN   HEMOGLOBIN  Date Value Ref Range Status  02/18/2014 8.2* 12.0 - 15.0 g/dL Final  04/07/2012 10.8* 12.0 - 15.0 g/dL Final  03/30/2012 9.8* 12.0 - 15.0 g/dL Final     Imaging Review No results found.   EKG Interpretation   Date/Time:  Sunday February 18 2014 16:53:43 EST Ventricular Rate:  79 PR Interval:  150 QRS Duration: 109 QT Interval:  402 QTC Calculation: 461 R Axis:   -38 Text Interpretation:  Sinus rhythm Atrial  premature complex LVH with  secondary repolarization abnormality Anterior Q waves, possibly due to LVH  No significant change was found Confirmed by Noriah Osgood  MD, Ayssa Bentivegna ([id number]) on  02/18/2014 6:13:45 PM      MDM   Final diagnoses:  Upper GI bleed    Coffee-ground emesis present in the bag in the room.  Will treat  with Zofran, Protonix, Protonix drip.  No gross blood on Hemoccult.  Hemoglobin 8.2.  No indication for transfusion at this time.  Hemodynamically stable.  Patient will need to be admitted for serial CBCs and possible endoscopy tomorrow.  No indication for emergent endoscopy at this time.    Hoy Morn, MD 02/18/14 [phone]

## 2014-02-18 NOTE — ED Notes (Signed)
Hospitalist at bedside 

## 2014-02-18 NOTE — ED Notes (Signed)
Patient refused tylenol. States that is does not work for her. We do not have oxycodone 20 mg IR in the pyxis. Patient going upstairs at this time.

## 2014-02-18 NOTE — ED Notes (Signed)
MD at bedside. 

## 2014-02-18 NOTE — ED Notes (Signed)
AC paged to bring medication from pharmacy to ED.

## 2014-02-18 NOTE — H&P (Signed)
History and Physical  Dorothy Brooks OBS:[id number] DOB: 1922-12-26 DOA: 02/18/2014  Referring physician: Dr Venora Maples, ED physician PCP: Monico Blitz, MD   Chief Complaint: Vomiting blood  HPI: Dorothy Brooks is a 80 y.o. female  With a history of coronary artery disease, status post NSTEMI in 2006, parents esophagus, gastritis, anemia. She presents to the hospital with 3-4 days of hematemesis. This is been worsening over the past few days. The patient finally let her granddaughter, who is her care provider, take her to the hospital for evaluation. She has had several episodes of hematemesis requiring hospitalization and one that required blood transfusion due to hemoglobin of 7. The granddaughter reports that this occurred approximately 6-7 months ago at [facility]. She currently has some mild abdominal pain.    Review of Systems:   Pt complains of suprapubic pain.  Pt denies any fevers, chills, diarrhea, chest pain, shortness of breath, wheezing, cough, lightheadedness, dizziness, blurred vision, presyncope.  Review of systems are otherwise negative  Past Medical History  Diagnosis Date  . Back pain   . Dyslipidemia   . Anemia   . Gastric ulcer   . Cancer     Uterine Cancer  . CAD (coronary artery disease)   . Ejection fraction     .  Marland Kitchen TIA (transient ischemic attack)   . Palpitations   . PVC's (premature ventricular contractions)   . Hypertension   . Hiatal hernia   . Varices, esophageal   . Prolapse of uterus    Past Surgical History  Procedure Laterality Date  . Back surgery      x 3  . Complete hysterectomy      uterine cancer  . Tonsillectomy    . Left cataract surgery    . Bilateral knee replacement in 2006    . Abdominal hysterectomy    . Esophagogastroduodenoscopy (egd) with esophageal dilation  03/30/2012    Procedure: ESOPHAGOGASTRODUODENOSCOPY (EGD) WITH ESOPHAGEAL DILATION;  Surgeon: Rogene Houston, MD;  Location: [facility];  Service: Endoscopy;   Laterality: N/A;  255-moved to 12:00pm Ann notified pt   Social History:  reports that she quit smoking about 54 years ago. Her smoking use included Cigarettes. She started smoking about 71 years ago. She has a 3.75 pack-year smoking history. She quit smokeless tobacco use about 54 years ago. Her smokeless tobacco use included Snuff and Chew. She reports that she does not drink alcohol or use illicit drugs. Patient lives at home & is able to participate in activities of daily living with assistance  Allergies  Allergen Reactions  . Aspirin Other (See Comments)    Cannot take due to Hiatal Hernia  . Sulfa Antibiotics Swelling  . Talwin [Pentazocine] Nausea And Vomiting    Deathly to patient.     History reviewed. No pertinent family history.    Prior to Admission medications   Medication Sig Start Date End Date Taking? Authorizing Provider  ALPRAZolam Duanne Moron) 0.5 MG tablet Take 0.25-0.5 mg by mouth at bedtime as needed for anxiety or sleep.    Yes Historical Provider, MD  amLODipine (NORVASC) 10 MG tablet Take 10 mg by mouth daily.   Yes Historical Provider, MD  citalopram (CELEXA) 40 MG tablet Take 40 mg by mouth daily.   Yes Historical Provider, MD  docusate sodium (COLACE) 100 MG capsule Take 100 mg by mouth 2 (two) times daily as needed for constipation.   Yes Historical Provider, MD  ipratropium-albuterol (DUONEB) 0.5-2.5 (3) MG/3ML SOLN  Take 3 mLs by nebulization every 4 (four) hours as needed (for shortness of breath/wheezing).    Yes Historical Provider, MD  nitroGLYCERIN (NITROSTAT) 0.4 MG SL tablet Place 1 tablet (0.4 mg total) under the tongue every 5 (five) minutes as needed for chest pain. 01/15/14  Yes Herminio Commons, MD  Oxycodone HCl 20 MG TABS Take 20 mg by mouth every 4 (four) hours as needed (for pain).    Yes Historical Provider, MD  pantoprazole (PROTONIX) 40 MG tablet Take 40 mg by mouth daily.   Yes Historical Provider, MD  rOPINIRole (REQUIP) 1 MG tablet Take 1  mg by mouth 2 (two) times daily.    Yes Historical Provider, MD  iron polysaccharides (NIFEREX) 150 MG capsule Take 150 mg by mouth daily.    Historical Provider, MD  metoprolol succinate (TOPROL XL) 25 MG 24 hr tablet Take 1 tablet (25 mg total) by mouth every morning. 01/02/14   Herminio Commons, MD    Physical Exam: BP 114/56 mmHg  Pulse 85  Temp(Src) 98.2 F (36.8 C) (Oral)  Resp 13  Ht 5\' 3"  (1.6 m)  Wt 58.968 kg (130 lb)  BMI 23.03 kg/m2  SpO2 92%  General: Elderly Caucasian female. Awake and alert and oriented x3. No acute cardiopulmonary distress.  Eyes: Pupils equal, round, reactive to light. Extraocular muscles are intact. Sclerae anicteric and noninjected.  ENT: Moist mucosal membranes with scant coffee-ground emesis in mouth. No mucosal lesions.   Neck: Neck supple without lymphadenopathy. No carotid bruits. No masses palpated.  Cardiovascular: Regular rate with normal S1-S2 sounds. No murmurs, rubs, gallops auscultated. No JVD.  Respiratory: Good respiratory effort with no wheezes, rales, rhonchi. Lungs clear to auscultation bilaterally.  Abdomen: Soft, mild left upper quadrant, epigastric, suprapubic tenderness to palpation.  Nondistended. Active bowel sounds. No masses or hepatosplenomegaly  Skin: Dry, warm to touch. 2+ dorsalis pedis and radial pulses. Musculoskeletal: No calf or leg pain. All major joints not erythematous nontender.  Psychiatric: Intact judgment and insight.  Neurologic: No focal neurological deficits. Cranial nerves II through XII are grossly intact.           Labs on Admission:  Basic Metabolic Panel:  Recent Labs Lab 02/18/14 1720  NA 137  K 3.9  CL 95*  CO2 27  GLUCOSE 143*  BUN 29*  CREATININE 3.44*  CALCIUM 9.3   Liver Function Tests:  Recent Labs Lab 02/18/14 1720  AST 18  ALT 9  ALKPHOS 90  BILITOT 0.4  PROT 7.7  ALBUMIN 4.0    Recent Labs Lab 02/18/14 1720  LIPASE 49   No results for input(s): AMMONIA in the  last 168 hours. CBC:  Recent Labs Lab 02/18/14 1720  WBC 7.6  NEUTROABS 4.7  HGB 8.2*  HCT 27.4*  MCV 75.3*  PLT 304   Cardiac Enzymes: No results for input(s): CKTOTAL, CKMB, CKMBINDEX, TROPONINI in the last 168 hours.  BNP (last 3 results) No results for input(s): PROBNP in the last 8760 hours. CBG: No results for input(s): GLUCAP in the last 168 hours.  Radiological Exams on Admission: No results found.  EKG: Independently reviewed. Sinus rhythm at 79 bpm. Normal PR and QRS intervals. Q waves in V1 and V2. Possible previous anterior MI  Assessment/Plan Present on Admission:  . Upper GI bleed . Suprapubic pain . Acute renal failure  #1 hematemesis with upper GI bleed We'll admit on telemetry and keep nothing by mouth. Continue with Protonix drip and IV fluids.  We'll consult GI in the morning for assistance in her care and determination whether she needs an upper endoscopy.  #2 suprapubic pain Will follow urinalysis to evaluate whether she has a urinary tract infection  #3 acute renal failure No history of renal failure per patient.  If has UTI, may be related to that.  Will follow and hydrate.    #4 coronary artery disease Continue beta blocker and Norvasc.  #5 chronic pain Continue pain control  DVT prophylaxis: SCDs as actively bleeding  Consultants: GI  Code Status: DO NOT RESUSCITATE  Family Communication: Granddaughter in the room who is caregiver   Disposition Plan: Home following resolution or stabilization  Time spent: 70 minutes  Loma Boston, DO [facility] Pager [phone]

## 2014-02-18 NOTE — ED Notes (Signed)
Patient c/o hematemesis that started 2 days ago. Per granddaughter patient has had this multiple times in which she has had to have multiple blood transfusions. Patient denies any rectal bleeding. Cough ground emesis noted. Patient reports she had right side chest pain earlier today but denies any at this time. Patient reports shortness of breath.

## 2014-02-19 ENCOUNTER — Inpatient Hospital Stay (HOSPITAL_COMMUNITY): Payer: Medicare Other

## 2014-02-19 ENCOUNTER — Encounter (HOSPITAL_COMMUNITY): Payer: Self-pay | Admitting: Gastroenterology

## 2014-02-19 DIAGNOSIS — K2270 Barrett's esophagus without dysplasia: Secondary | ICD-10-CM

## 2014-02-19 DIAGNOSIS — G9340 Encephalopathy, unspecified: Secondary | ICD-10-CM

## 2014-02-19 DIAGNOSIS — D62 Acute posthemorrhagic anemia: Secondary | ICD-10-CM

## 2014-02-19 DIAGNOSIS — N2889 Other specified disorders of kidney and ureter: Secondary | ICD-10-CM

## 2014-02-19 DIAGNOSIS — N390 Urinary tract infection, site not specified: Secondary | ICD-10-CM

## 2014-02-19 DIAGNOSIS — K279 Peptic ulcer, site unspecified, unspecified as acute or chronic, without hemorrhage or perforation: Secondary | ICD-10-CM | POA: Diagnosis present

## 2014-02-19 DIAGNOSIS — G253 Myoclonus: Secondary | ICD-10-CM | POA: Diagnosis present

## 2014-02-19 DIAGNOSIS — R410 Disorientation, unspecified: Secondary | ICD-10-CM | POA: Diagnosis present

## 2014-02-19 DIAGNOSIS — J9601 Acute respiratory failure with hypoxia: Secondary | ICD-10-CM

## 2014-02-19 DIAGNOSIS — R220 Localized swelling, mass and lump, head: Secondary | ICD-10-CM | POA: Diagnosis present

## 2014-02-19 DIAGNOSIS — R918 Other nonspecific abnormal finding of lung field: Secondary | ICD-10-CM

## 2014-02-19 HISTORY — DX: Other nonspecific abnormal finding of lung field: R91.8

## 2014-02-19 HISTORY — DX: Other specified disorders of kidney and ureter: N28.89

## 2014-02-19 LAB — BLOOD GAS, ARTERIAL
ACID-BASE DEFICIT: 3.4 mmol/L — AB (ref 0.0–2.0)
ACID-BASE DEFICIT: 4.3 mmol/L — AB (ref 0.0–2.0)
Bicarbonate: 22.7 mEq/L (ref 20.0–24.0)
Bicarbonate: 22.9 mEq/L (ref 20.0–24.0)
DRAWN BY: 22223
DRAWN BY: 23534
FIO2: 28 %
O2 CONTENT: 2 L/min
O2 CONTENT: 3 L/min
O2 SAT: 93.9 %
O2 Saturation: 87.7 %
PCO2 ART: 52.8 mmHg — AB (ref 35.0–45.0)
PCO2 ART: 63.3 mmHg — AB (ref 35.0–45.0)
PH ART: 7.256 — AB (ref 7.350–7.450)
PO2 ART: 61.5 mmHg — AB (ref 80.0–100.0)
Patient temperature: 37
Patient temperature: 37
TCO2: 22.5 mmol/L (ref 0–100)
TCO2: 22.3 mmol/L (ref 0–100)
pH, Arterial: 7.184 — CL (ref 7.350–7.450)
pO2, Arterial: 81.1 mmHg (ref 80.0–100.0)

## 2014-02-19 LAB — HEPATIC FUNCTION PANEL
ALBUMIN: 3.4 g/dL — AB (ref 3.5–5.2)
ALT: 9 U/L (ref 0–35)
AST: 23 U/L (ref 0–37)
Alkaline Phosphatase: 81 U/L (ref 39–117)
Bilirubin, Direct: <0.2 mg/dL (ref 0.0–0.3)
TOTAL PROTEIN: 6.7 g/dL (ref 6.0–8.3)
Total Bilirubin: 0.3 mg/dL (ref 0.3–1.2)

## 2014-02-19 LAB — CBC
HCT: 32.1 % — ABNORMAL LOW (ref 36.0–46.0)
HCT: 30.2 % — ABNORMAL LOW (ref 36.0–46.0)
HEMATOCRIT: 24.4 % — AB (ref 36.0–46.0)
Hemoglobin: 7.2 g/dL — ABNORMAL LOW (ref 12.0–15.0)
Hemoglobin: 10.2 g/dL — ABNORMAL LOW (ref 12.0–15.0)
Hemoglobin: 9.6 g/dL — ABNORMAL LOW (ref 12.0–15.0)
MCH: 22.4 pg — ABNORMAL LOW (ref 26.0–34.0)
MCH: 25 pg — AB (ref 26.0–34.0)
MCH: 24.9 pg — AB (ref 26.0–34.0)
MCHC: 29.5 g/dL — ABNORMAL LOW (ref 30.0–36.0)
MCHC: 31.8 g/dL (ref 30.0–36.0)
MCHC: 31.8 g/dL (ref 30.0–36.0)
MCV: 76 fL — AB (ref 78.0–100.0)
MCV: 78.7 fL (ref 78.0–100.0)
MCV: 78.2 fL (ref 78.0–100.0)
PLATELETS: 184 10*3/uL (ref 150–400)
Platelets: 271 10*3/uL (ref 150–400)
Platelets: 202 10*3/uL (ref 150–400)
RBC: 3.21 MIL/uL — ABNORMAL LOW (ref 3.87–5.11)
RBC: 4.08 MIL/uL (ref 3.87–5.11)
RBC: 3.86 MIL/uL — ABNORMAL LOW (ref 3.87–5.11)
RDW: 17.5 % — ABNORMAL HIGH (ref 11.5–15.5)
RDW: 16.7 % — ABNORMAL HIGH (ref 11.5–15.5)
RDW: 16.7 % — AB (ref 11.5–15.5)
WBC: 7.4 10*3/uL (ref 4.0–10.5)
WBC: 6 10*3/uL (ref 4.0–10.5)
WBC: 4.9 10*3/uL (ref 4.0–10.5)

## 2014-02-19 LAB — BASIC METABOLIC PANEL
Anion gap: 13 (ref 5–15)
BUN: 31 mg/dL — AB (ref 6–23)
CO2: 24 meq/L (ref 19–32)
CREATININE: 2.98 mg/dL — AB (ref 0.50–1.10)
Calcium: 8.4 mg/dL (ref 8.4–10.5)
Chloride: 102 mEq/L (ref 96–112)
GFR calc Af Amer: 15 mL/min — ABNORMAL LOW (ref >90)
GFR calc non Af Amer: 13 mL/min — ABNORMAL LOW (ref >90)
Glucose, Bld: 124 mg/dL — ABNORMAL HIGH (ref 70–99)
Potassium: 3.9 mEq/L (ref 3.7–5.3)
Sodium: 139 mEq/L (ref 137–147)

## 2014-02-19 LAB — ABO/RH: ABO/RH(D): A POS

## 2014-02-19 LAB — HEMOGLOBIN A1C
Hgb A1c MFr Bld: 6 % — ABNORMAL HIGH (ref <5.7)
Mean Plasma Glucose: 126 mg/dL — ABNORMAL HIGH (ref <117)

## 2014-02-19 LAB — APTT: APTT: 28 s (ref 24–37)

## 2014-02-19 LAB — PREPARE RBC (CROSSMATCH)

## 2014-02-19 LAB — PROTIME-INR
INR: 1.18 (ref 0.00–1.49)
Prothrombin Time: 15.2 seconds (ref 11.6–15.2)

## 2014-02-19 LAB — LACTIC ACID, PLASMA: Lactic Acid, Venous: 0.9 mmol/L (ref 0.5–2.2)

## 2014-02-19 LAB — MRSA PCR SCREENING: MRSA BY PCR: NEGATIVE

## 2014-02-19 LAB — FERRITIN: FERRITIN: 10 ng/mL (ref 10–291)

## 2014-02-19 LAB — VITAMIN B12: Vitamin B-12: 290 pg/mL (ref 211–911)

## 2014-02-19 MED ORDER — CITALOPRAM HYDROBROMIDE 20 MG PO TABS
20.0000 mg | ORAL_TABLET | Freq: Every day | ORAL | Status: DC
  Administered 2014-02-20 – 2014-02-21 (×2): 20 mg via ORAL
  Filled 2014-02-19 (×2): qty 1

## 2014-02-19 MED ORDER — LEVALBUTEROL HCL 0.63 MG/3ML IN NEBU
0.6300 mg | INHALATION_SOLUTION | Freq: Three times a day (TID) | RESPIRATORY_TRACT | Status: DC
  Administered 2014-02-19 – 2014-02-21 (×7): 0.63 mg via RESPIRATORY_TRACT
  Filled 2014-02-19 (×7): qty 3

## 2014-02-19 MED ORDER — METHYLPREDNISOLONE SODIUM SUCC 40 MG IJ SOLR
20.0000 mg | Freq: Once | INTRAMUSCULAR | Status: AC
  Administered 2014-02-19: 20 mg via INTRAVENOUS
  Filled 2014-02-19: qty 1

## 2014-02-19 MED ORDER — CIPROFLOXACIN IN D5W 400 MG/200ML IV SOLN
400.0000 mg | INTRAVENOUS | Status: DC
  Administered 2014-02-19 – 2014-02-21 (×3): 400 mg via INTRAVENOUS
  Filled 2014-02-19 (×3): qty 200

## 2014-02-19 MED ORDER — MORPHINE SULFATE 4 MG/ML IJ SOLN
4.0000 mg | Freq: Once | INTRAMUSCULAR | Status: AC
  Administered 2014-02-19: 4 mg via INTRAVENOUS
  Filled 2014-02-19: qty 1

## 2014-02-19 MED ORDER — DIPHENHYDRAMINE HCL 50 MG/ML IJ SOLN
25.0000 mg | Freq: Once | INTRAMUSCULAR | Status: AC
  Administered 2014-02-19: 25 mg via INTRAVENOUS
  Filled 2014-02-19: qty 1

## 2014-02-19 MED ORDER — FUROSEMIDE 10 MG/ML IJ SOLN
20.0000 mg | Freq: Once | INTRAMUSCULAR | Status: AC
  Administered 2014-02-19: 20 mg via INTRAVENOUS
  Filled 2014-02-19: qty 2

## 2014-02-19 MED ORDER — SODIUM CHLORIDE 0.9 % IV SOLN
Freq: Once | INTRAVENOUS | Status: AC
  Administered 2014-02-19: 10:00:00 via INTRAVENOUS

## 2014-02-19 NOTE — Progress Notes (Signed)
Patient's family concerned about low urine output. Bladder scanner showed less than 200cc in bladder. Will reassess as needed.

## 2014-02-19 NOTE — Progress Notes (Signed)
ANTIBIOTIC CONSULT NOTE - INITIAL  Pharmacy Consult for Cipro Indication: UTI  Allergies  Allergen Reactions  . Aspirin Other (See Comments)    Cannot take due to Hiatal Hernia  . Sulfa Antibiotics Swelling  . Talwin [Pentazocine] Nausea And Vomiting    Deathly to patient.    Patient Measurements: Height: 5\' 3"  (160 cm) Weight: 126 lb 8.7 oz (57.4 kg) IBW/kg (Calculated) : 52.4  Vital Signs: Temp: 99.2 F (37.3 C) (12/14 0646) Temp Source: Oral (12/14 0646) BP: 119/67 mmHg (12/14 0646) Pulse Rate: 93 (12/14 0646) Intake/Output from previous day:   Intake/Output from this shift:    Labs:  Recent Labs  02/18/14 1720 02/19/14 0605  WBC 7.6 7.4  HGB 8.2* 7.2*  PLT 304 271  CREATININE 3.44* 2.98*   Estimated Creatinine Clearance: 10.6 mL/min (by C-G formula based on Cr of 2.98). No results for input(s): VANCOTROUGH, VANCOPEAK, VANCORANDOM, GENTTROUGH, GENTPEAK, GENTRANDOM, TOBRATROUGH, TOBRAPEAK, TOBRARND, AMIKACINPEAK, AMIKACINTROU, AMIKACIN in the last 72 hours.   Microbiology: No results found for this or any previous visit (from the past 720 hour(s)).  Medical History: Past Medical History  Diagnosis Date  . Back pain   . Dyslipidemia   . Anemia   . Gastric ulcer   . Cancer     Uterine Cancer  . CAD (coronary artery disease)   . Ejection fraction     .  Marland Kitchen TIA (transient ischemic attack)   . Palpitations   . PVC's (premature ventricular contractions)   . Hypertension   . Hiatal hernia   . Varices, esophageal   . Prolapse of uterus    Anti-infectives    Start     Dose/Rate Route Frequency Ordered Stop   02/19/14 1000  ciprofloxacin (CIPRO) IVPB 400 mg     400 mg200 mL/hr over 60 Minutes Intravenous Every 24 hours 02/19/14 0736     02/18/14 2015  cefTRIAXone (ROCEPHIN) 1 g in dextrose 5 % 50 mL IVPB  Status:  Discontinued     1 g100 mL/hr over 30 Minutes Intravenous Every 24 hours 02/18/14 2012 02/19/14 0733     Assessment: 80yo female admitted  with confusion, SOB, hypoxia and suspected UTI.  SCr is elevated on admission.  Estimated Creatinine Clearance: 10.6 mL/min (by C-G formula based on Cr of 2.98).  Goal of Therapy:  Eradicate infection.  Plan:  Cipro 400mg  IV q24hrs (renally adjusted) Monitor labs, renal fxn, progress, and cultures  [place] Crane, Jalayla Chrismer A 02/19/2014,10:24 AM

## 2014-02-19 NOTE — Consult Note (Signed)
Referring Provider: Dr. Loma Boston Primary Care Physician:  Monico Blitz, MD Primary Gastroenterologist:  Dr. Laural Golden  Date of Admission: 02/18/14 Date of Consultation: 02/20/14  Reason for Consultation:  Hematemesis  HPI:  Dorothy Brooks is an 80 year old female with a history of IDA, short segment Barrett's, PUD, presenting this admission with hematemesis. Admitting Hgb 8.2, down to 7.2 this morning. Last known Hgb on file from 2014 at 9.8.  2 units PRBCs ordered. Due to involuntary "jerking" throughout the night and confusion, CT head, CXR, lactic acid and ABG ordered . Appears chronic but worsened with UTI per granddaughter. Granddaughter present at bedside and heavily involved in care. States she lives with her grandmother and hasn't left her side in 5 years. All information obtained from granddaughter.   Granddaughter states 2 days of hematemesis but was not notified by patient until yesterday. Coffee-ground emesis. Believes stool may have been black, tarry when in the ED per patient. Epigastric discomfort reported but no change in eating habits. "eats like a bird". Protonix daily and Ibuprofen prn. States she rocks back and forth, jerking with UTIs. Concerned about lower lip, stating it is swollen. Picture shown of patient on cell phone, with noticeably smaller, thin lower lip. Heme negative via stool sample in ED.   Last colonoscopy in remote past. Last EGD in Jan 2014 by Dr. Laural Golden with short segment Barrett's, small distal esophageal ulcer, large sliding hiatal hernia with focal gastritis at level of hiatus, antral scar but without evidence of active PUD. Unable to find H.pylori serologies in epic.   Past Medical History  Diagnosis Date  . Back pain   . Dyslipidemia   . Anemia   . Gastric ulcer   . Cancer     Uterine Cancer  . CAD (coronary artery disease)   . Ejection fraction     .  Marland Kitchen TIA (transient ischemic attack)   . Palpitations   . PVC's (premature ventricular  contractions)   . Hypertension   . Hiatal hernia   . Varices, esophageal   . Prolapse of uterus     Past Surgical History  Procedure Laterality Date  . Back surgery      x 3  . Complete hysterectomy      uterine cancer  . Tonsillectomy    . Left cataract surgery    . Bilateral knee replacement in 2006    . Abdominal hysterectomy    . Esophagogastroduodenoscopy (egd) with esophageal dilation  03/30/2012    Dr. Laural Golden: short segment Barrett's without dysplasia, large sliding hiatal hernia with focal gastritis at level of hiatus, antral scar but without active PUD  . Esophagogastroduodenoscopy  Nov 2010    Dr. Laural Golden: short segment Barrett's with low grade dysplasia, large sliding hiatal hernia, healed esophagitis previously noted April 2010  . Esophagogastroduodenoscopy  April 2010    Dr. Laural Golden: Barrett's with low-grade dysplasia, extensive ulcerative esophagitis, reported dilation    Prior to Admission medications   Medication Sig Start Date End Date Taking? Authorizing Provider  ALPRAZolam Duanne Moron) 0.5 MG tablet Take 0.25-0.5 mg by mouth at bedtime as needed for anxiety or sleep.    Yes Historical Provider, MD  amLODipine (NORVASC) 10 MG tablet Take 10 mg by mouth daily.   Yes Historical Provider, MD  citalopram (CELEXA) 40 MG tablet Take 40 mg by mouth daily.   Yes Historical Provider, MD  docusate sodium (COLACE) 100 MG capsule Take 100 mg by mouth 2 (two) times daily as  needed for constipation.   Yes Historical Provider, MD  ipratropium-albuterol (DUONEB) 0.5-2.5 (3) MG/3ML SOLN Take 3 mLs by nebulization every 4 (four) hours as needed (for shortness of breath/wheezing).    Yes Historical Provider, MD  nitroGLYCERIN (NITROSTAT) 0.4 MG SL tablet Place 1 tablet (0.4 mg total) under the tongue every 5 (five) minutes as needed for chest pain. 01/15/14  Yes Herminio Commons, MD  Oxycodone HCl 20 MG TABS Take 20 mg by mouth every 4 (four) hours as needed (for pain).    Yes Historical  Provider, MD  pantoprazole (PROTONIX) 40 MG tablet Take 40 mg by mouth daily.   Yes Historical Provider, MD  rOPINIRole (REQUIP) 1 MG tablet Take 1 mg by mouth 2 (two) times daily.    Yes Historical Provider, MD  iron polysaccharides (NIFEREX) 150 MG capsule Take 150 mg by mouth daily.    Historical Provider, MD  metoprolol succinate (TOPROL XL) 25 MG 24 hr tablet Take 1 tablet (25 mg total) by mouth every morning. 01/02/14   Herminio Commons, MD    Current Facility-Administered Medications  Medication Dose Route Frequency Provider Last Rate Last Dose  . 0.9 %  sodium chloride infusion   Intravenous Continuous Truett Mainland, DO 75 mL/hr at 02/18/14 2148    . 0.9 %  sodium chloride infusion   Intravenous Once Rexene Alberts, MD      . acetaminophen (TYLENOL) tablet 650 mg  650 mg Oral Q6H PRN Truett Mainland, DO       Or  . acetaminophen (TYLENOL) suppository 650 mg  650 mg Rectal Q6H PRN Tanna Savoy Stinson, DO      . ALPRAZolam Duanne Moron) tablet 0.25-0.5 mg  0.25-0.5 mg Oral QHS PRN Truett Mainland, DO   0.5 mg at 02/18/14 2146  . alum & mag hydroxide-simeth (MAALOX/MYLANTA) 200-200-20 MG/5ML suspension 30 mL  30 mL Oral Q6H PRN Tanna Savoy Stinson, DO      . ciprofloxacin (CIPRO) IVPB 400 mg  400 mg Intravenous Q24H Rexene Alberts, MD      . citalopram (CELEXA) tablet 40 mg  40 mg Oral Daily Tanna Savoy Stinson, DO   40 mg at 02/18/14 2147  . docusate sodium (COLACE) capsule 100 mg  100 mg Oral BID PRN Tanna Savoy Stinson, DO      . docusate sodium (COLACE) capsule 100 mg  100 mg Oral Daily PRN Truett Mainland, DO      . Influenza vac split quadrivalent PF (FLUARIX) injection 0.5 mL  0.5 mL Intramuscular Tomorrow-1000 Tanna Savoy Stinson, DO      . ipratropium-albuterol (DUONEB) 0.5-2.5 (3) MG/3ML nebulizer solution 3 mL  3 mL Nebulization Q4H PRN Tanna Savoy Stinson, DO      . levalbuterol Penne Lash) nebulizer solution 0.63 mg  0.63 mg Nebulization Q8H Rexene Alberts, MD   0.63 mg at 02/19/14 8099  .  methylPREDNISolone sodium succinate (SOLU-MEDROL) 40 mg/mL injection 20 mg  20 mg Intravenous Once Rexene Alberts, MD      . nitroGLYCERIN (NITROSTAT) SL tablet 0.4 mg  0.4 mg Sublingual Q5 min PRN Tanna Savoy Stinson, DO      . ondansetron [facility]) tablet 4 mg  4 mg Oral Q6H PRN Tanna Savoy Stinson, DO       Or  . ondansetron [facility]) injection 4 mg  4 mg Intravenous Q6H PRN Tanna Savoy Stinson, DO      . oxyCODONE (Oxy IR/ROXICODONE) immediate release tablet 20 mg  20 mg Oral  Q4H PRN Tanna Savoy Stinson, DO   20 mg at 02/19/14 0701  . pantoprazole (PROTONIX) 80 mg in sodium chloride 0.9 % 250 mL (0.32 mg/mL) infusion  8 mg/hr Intravenous Continuous Hoy Morn, MD 25 mL/hr at 02/18/14 2148 8 mg/hr at 02/18/14 2148  . rOPINIRole (REQUIP) tablet 1 mg  1 mg Oral BID Tanna Savoy Stinson, DO   1 mg at 02/18/14 2221    Allergies as of 02/18/2014 - Review Complete 02/18/2014  Allergen Reaction Noted  . Aspirin Other (See Comments) 02/18/2014  . Sulfa antibiotics Swelling 02/18/2014  . Talwin [pentazocine] Nausea And Vomiting 03/22/2012    Family History  Problem Relation Age of Onset  . Colon cancer Neg Hx     History   Social History  . Marital Status: Widowed    Spouse Name: N/A    Number of Children: N/A  . Years of Education: N/A   Occupational History  . Not on file.   Social History Main Topics  . Smoking status: Former Smoker -- 0.25 packs/day for 15 years    Types: Cigarettes    Start date: 12/04/1942    Quit date: 12/04/1959  . Smokeless tobacco: Former Systems developer    Types: Snuff, Chew    Quit date: 12/04/1959  . Alcohol Use: No  . Drug Use: No  . Sexual Activity: Not on file   Other Topics Concern  . Not on file   Social History Narrative    Review of Systems: Unable to obtain due to patient's cognitive status   Physical Exam: Vital signs in last 24 hours: Temp:  [98.2 F (36.8 C)-99.2 F (37.3 C)] 99.2 F (37.3 C) (12/14 0646) Pulse Rate:  [83-93] 93 (12/14 0646) Resp:   [13-21] 21 (12/14 0646) BP: (100-129)/(39-69) 119/67 mmHg (12/14 0646) SpO2:  [90 %-97 %] 97 % (12/14 0812) Weight:  [126 lb 8.7 oz (57.4 kg)-130 lb (58.968 kg)] 126 lb 8.7 oz (57.4 kg) (12/13 2058) Last BM Date: 02/18/14 General:   Drowsy but requesting to sit up straight to "breathe better" Head:  Normocephalic and atraumatic. Eyes:  Sclera clear, no icterus.    Ears:  Hard of hearing Nose:  No deformity, discharge,  or lesions. Mouth:  No deformity or lesions Lungs:  Clear in anterior chest but diminished bases Heart:  S1 S2 present, with 2/6 systolic murmur noted  Abdomen:  Limited exam with patient sitting straight up in bed. +BS, Soft, nontender and nondistended.  Rectal:  Deferred  Msk:  Kyphosis. Intermittent jerking forward.  Extremities:  Without edema. Neurologic:  Easily awakens to verbal stimuli, knows the year, states [place] regarding location.    Intake/Output from previous day:   Intake/Output this shift:    Lab Results:  Recent Labs  02/18/14 1720 02/19/14 0605  WBC 7.6 7.4  HGB 8.2* 7.2*  HCT 27.4* 24.4*  PLT 304 271   BMET  Recent Labs  02/18/14 1720 02/19/14 0605  NA 137 139  K 3.9 3.9  CL 95* 102  CO2 27 24  GLUCOSE 143* 124*  BUN 29* 31*  CREATININE 3.44* 2.98*  CALCIUM 9.3 8.4   LFT  Recent Labs  02/18/14 1720 02/19/14 0605  PROT 7.7 6.7  ALBUMIN 4.0 3.4*  AST 18 23  ALT 9 9  ALKPHOS 90 81  BILITOT 0.4 0.3  BILIDIR  --  <0.2  IBILI  --  NOT CALCULATED   PT/INR  Recent Labs  02/19/14 0800  LABPROT 15.2  INR  1.18    Studies/Results: CT head without contrast ordered. CXR ordered    Impression: 80 year old female with history of IDA, Barrett's esophagus, PUD, presenting with acute on chronic anemia, coffee-ground emesis, UTI, acute kidney injury, acute respiratory failure with CXR pending, and change in mental status. Hgb down 1 gram since admission but without further evidence of overt GI bleeding; 2 units  PRBCs have been ordered. As of note, granddaughter states Hgb baseline is around 9. Last EGD in 2014 as noted above. Needs to be stabilized from a respiratory standpoint prior to upper GI evaluation. History of known large hiatal hernia, with reports of epigastric discomfort and N/V concerning; will review imaging as it comes available. In setting of intermittent NSAIDs, hematemesis likely secondary to esophagitis, gastritis, PUD. Agree with 2 units PRBCs, CT head to rule out acute process, CXR. Continue PPI infusion, remain NPO, and pursue EGD while inpatient once clinically stable.   Plan: NPO PPI infusion CXR pending, CT head pending Monitor for further overt GI bleeding Supportive measures to include anti-emetics EGD once clinically stabilized CBC every 6 hours   Orvil Feil, ANP-BC [facility]     LOS: 1 day    02/19/2014, 8:55 AM  Attending note:  Head CT negative for anything acute. Chest x-ray shows atelectasis.  Patient moved to ICU given marked mental status changes, hypoxemia and infection. She has displayed no further bleeding since admission. Heme-negative stool in ED. Positive NSAID use at home (ibuprofen). Receiving 2 units of packed red blood cells. PPI infusion ongoing. She is hemodynamically stable. Discussed at Dr. Caryn Section and patient's primary caregiver, her granddaughter. Patient needs an EGD; will be best to hold off until other problems have stabilized. Dr. Laural Golden will look in on her in the morning.

## 2014-02-19 NOTE — Progress Notes (Signed)
0841 Transferred to ICU room# 9 as ordered by MD. Purcell Nails, RN aware & at bedside to receive pt.

## 2014-02-19 NOTE — Progress Notes (Signed)
Non-contrasted chest CT was ordered to evaluate for hypoxia; primarily to rule out pneumonia or pulmonary edema. It revealed neither, however, it did reveal a 2.0 x 2.8 x 2.5 cm spiculated solid and cavitary lesion in the left lower lobe worrisome for malignancy. It also revealed a 1.8 cm hyperdense lesion emanating from the upper pole of the left kidney.   I discussed the findings with the patient's granddaughter and daughter-in-law. They did not believe the patient would be strong enough to undergo extensive workup of the lung mass, however they would need to tolerate to the patient's sons. I agreed that the patient would likely not tolerate a lung biopsy, etc. However, given her hypoxia, a VQ scan would assess for pulmonary embolism. I informed them that if it did reveal findings consistent with a pulmonary embolus, she would not be an immediate candidate for anticoagulation secondary to her upper GI bleeding. We'll order a renal ultrasound to evaluate the left kidney mass. Overall, I believe this patient should not undergo extensive evaluation for malignancy, but this would be the ultimate decision of the family who at this point is leaning toward less aggressive diagnostic workup and/or treatment.

## 2014-02-19 NOTE — Progress Notes (Signed)
[facility] PROGRESS NOTE  Dorothy Brooks:[id number] DOB: [birth year] DOA: 02/18/2014 PCP: Monico Blitz, MD    Code Status: DO NOT RESUSCITATE Family Communication: Discussed with granddaughter Disposition Plan: Discharge to when clinically appropriate.   Consultants:  GI pending  Procedures:  None  Antibiotics:  Cipro 12/14  Rocephin 12/13-12/14/2015.  HPI/Subjective: I was notified to follow-up on this patient who was admitted yesterday for acute confusion, shortness of breath and hypoxia. When I entered the room, the patient was semi-alert, but was oriented to herself, her granddaughter, and hospital. She did not endorse shortness of breath. She denies abdominal pain. She denies chest pain. The daughter states that she has chronic jerking that worsens when she has UTI. She has never been hypoxic before. Granddaughter also reports that her bottom lip is swollen and ask if she was given any of the medications she was allergic to. Upon review, she had not. Apparently, she had not had Rocephin in the past, so it is unclear if she is allergic to this medication. She has not had any vomiting of blood since she came to the floor last night.  Objective: Filed Vitals:   02/19/14 0646  BP: 119/67  Pulse: 93  Temp: 99.2 F (37.3 C)  Resp: 21   No intake or output data in the 24 hours ending 02/19/14 0735 Filed Weights   02/18/14 1655 02/18/14 2058  Weight: 58.968 kg (130 lb) 57.4 kg (126 lb 8.7 oz)    Exam:   General:  Elderly 80 year old Caucasian woman sitting up in bed, rocking back and forth with occasional myoclonic jerks. She is semi-alert, but becomes alert and tries to follow small commands.  Oropharynx: Bottom lip slightly swollen. No posterior exudates or erythema.  Cardiovascular: S1, S2, with a 2 to 3/6 systolic murmur.  Respiratory: Lungs clear anteriorly with decreased breath sounds in the bases. No significant audible wheezes or crackles.  Abdomen:  Positive bowel sounds, soft, nontender, nondistended.  Musculoskeletal: No acute hot red joints, but she is tender over both hips without erythema, edema, or warmth. She has occasional jerky motions of her lower extremities. Pedal pulses palpable. No pedal edema.  Neurologic: She is semi-alert, but becomes alert. She is oriented to herself, granddaughter, and hospital. She is not oriented to month or year. She tries to follow small commands. Cranial nerves II through XII appear to be grossly intact.  Data Reviewed: Basic Metabolic Panel:  Recent Labs Lab 02/18/14 1720 02/19/14 0605  NA 137 139  K 3.9 3.9  CL 95* 102  CO2 27 24  GLUCOSE 143* 124*  BUN 29* 31*  CREATININE 3.44* 2.98*  CALCIUM 9.3 8.4   Liver Function Tests:  Recent Labs Lab 02/18/14 1720  AST 18  ALT 9  ALKPHOS 90  BILITOT 0.4  PROT 7.7  ALBUMIN 4.0    Recent Labs Lab 02/18/14 1720  LIPASE 49   No results for input(s): AMMONIA in the last 168 hours. CBC:  Recent Labs Lab 02/18/14 1720 02/19/14 0605  WBC 7.6 7.4  NEUTROABS 4.7  --   HGB 8.2* 7.2*  HCT 27.4* 24.4*  MCV 75.3* 76.0*  PLT 304 271   Cardiac Enzymes: No results for input(s): CKTOTAL, CKMB, CKMBINDEX, TROPONINI in the last 168 hours. BNP (last 3 results) No results for input(s): PROBNP in the last 8760 hours. CBG: No results for input(s): GLUCAP in the last 168 hours.  No results found for this or any previous visit (from the past 240 hour(s)).  Studies: No results found.  Scheduled Meds: . sodium chloride   Intravenous Once  . amLODipine  10 mg Oral Daily  . citalopram  40 mg Oral Daily  . Influenza vac split quadrivalent PF  0.5 mL Intramuscular Tomorrow-1000  . metoprolol succinate  25 mg Oral q morning - 10a  . rOPINIRole  1 mg Oral BID   Continuous Infusions: . sodium chloride 75 mL/hr at 02/18/14 2148  . pantoprozole (PROTONIX) infusion 8 mg/hr (02/18/14 2148)    Assessment and plan:  Principal Problem:    Upper GI bleed Active Problems:   Barrett's esophagus   Acute blood loss anemia   PUD (peptic ulcer disease)   Acute renal failure   UTI (urinary tract infection)   Acute respiratory failure with hypoxia   Acute encephalopathy   Swollen lip   1. Upper GI bleed. No active hematemesis currently. The patient has a history of Barrett's esophagitis and peptic ulcer disease. Per EGD by Dr. Laural Golden in January 2014, there was short segment of Barrett's esophagitis with a distal esophageal ulcer; focal gastritis at the level of the hiatus, and antral scar but no evidence of active peptic ulcer disease. She may have recurrent exacerbation of esophagitis or peptic ulcer disease. Per granddaughter, the patient had been compliant with Protonix. She does not take NSAIDs. We'll continue Protonix drip. GI consulted. We'll transfer to the stepdown unit for closer monitoring.  Acute blood loss anemia. Her hemoglobin was 8.2 on admission and has followed a 7.2. Which may be secondary to equilibration from upper GI bleeding and hemodilution. We'll transfuse 2 units of packed red blood cells and keep 2 units ahead. Continue Protonix drip. GI consulted. We'll cycle CBCs every 6 hours. We'll transfer to the stepdown unit for closer monitoring.  Shortness of breath with acute respiratory failure with hypoxia and hypercapnia. ABG on 2 L noted for a pH of 7.26, PCO2 of 53, and PO2 of 62. Patient was placed on oxygen. We'll hold off on BiPAP for now. We'll repeat ABG in a few hours. Chest x-ray ordered and is pending. We'll start Xopenex nebulizers.  Swollen lower lip. Etiology is unclear, but she does have allergies to sulfa antibiotics which were not given.  She was given Rocephin earlier for the UTI, but is unclear if this is the culprit. She is status post IV Benadryl. We'll give her 1 dose of Solu-Medrol. There appears to be no significant oropharyngeal edema.  UTI. We'll discontinue Rocephin and  start Cipro, renal dosing per pharmacy.  Acute kidney injury. This is presumed. I do not see a baseline creatinine, but in light of the GI bleed, this is likely prerenal azotemia. We'll continue  Essential hypertension. Blood pressure is low-normal, so will hold metoprolol and amlodipine.  Myoclonic jerking. Per granddaughter, the patient has chronic jerking, but it worsens when she has a UTI. Continue Requip. We'll continue to monitor and provide supportive treatment.  Acute encephalopathy. Although she has not been given a diagnosis, it appears that she does have mild dementia. Acute confusion likely secondary to hypoxia and/or UTI. CT of the head pending.   Time spent: 40 minutes.    [facility] Pager [phone]. If 7PM-7AM, please contact night-coverage at [email or web address], password [facility] 02/19/2014, 7:35 AM  LOS: 1 day

## 2014-02-19 NOTE — Progress Notes (Signed)
Pt has been having involuntary jerk reactions throughout the night. Granddaughter says it is normal for pt to do this when she has a UTI but that it has progressively throughout the night gotten worse than normal. Pt has also become much more confused and not able to focus on anyone or anything. Oxygen level was low so placed her on 2L O2 [place]. Paged Dr. Shanon Brow and she ordered CT scan of head, chest xray, lactic acid and ABG.

## 2014-02-20 ENCOUNTER — Inpatient Hospital Stay (HOSPITAL_COMMUNITY): Payer: Medicare Other

## 2014-02-20 DIAGNOSIS — K920 Hematemesis: Secondary | ICD-10-CM

## 2014-02-20 DIAGNOSIS — J984 Other disorders of lung: Secondary | ICD-10-CM

## 2014-02-20 DIAGNOSIS — K922 Gastrointestinal hemorrhage, unspecified: Secondary | ICD-10-CM

## 2014-02-20 DIAGNOSIS — D649 Anemia, unspecified: Secondary | ICD-10-CM

## 2014-02-20 LAB — CBC
HCT: 29.8 % — ABNORMAL LOW (ref 36.0–46.0)
HCT: 29.8 % — ABNORMAL LOW (ref 36.0–46.0)
HCT: 28 % — ABNORMAL LOW (ref 36.0–46.0)
HEMOGLOBIN: 8.8 g/dL — AB (ref 12.0–15.0)
Hemoglobin: 9.6 g/dL — ABNORMAL LOW (ref 12.0–15.0)
Hemoglobin: 9.4 g/dL — ABNORMAL LOW (ref 12.0–15.0)
MCH: 25.1 pg — AB (ref 26.0–34.0)
MCH: 24.7 pg — AB (ref 26.0–34.0)
MCH: 24.9 pg — AB (ref 26.0–34.0)
MCHC: 32.2 g/dL (ref 30.0–36.0)
MCHC: 31.5 g/dL (ref 30.0–36.0)
MCHC: 31.4 g/dL (ref 30.0–36.0)
MCV: 78 fL (ref 78.0–100.0)
MCV: 78.2 fL (ref 78.0–100.0)
MCV: 79.1 fL (ref 78.0–100.0)
PLATELETS: 184 10*3/uL (ref 150–400)
PLATELETS: 185 10*3/uL (ref 150–400)
PLATELETS: 185 10*3/uL (ref 150–400)
RBC: 3.82 MIL/uL — ABNORMAL LOW (ref 3.87–5.11)
RBC: 3.81 MIL/uL — ABNORMAL LOW (ref 3.87–5.11)
RBC: 3.54 MIL/uL — ABNORMAL LOW (ref 3.87–5.11)
RDW: 16.7 % — AB (ref 11.5–15.5)
RDW: 17 % — ABNORMAL HIGH (ref 11.5–15.5)
RDW: 17.2 % — ABNORMAL HIGH (ref 11.5–15.5)
WBC: 4.8 10*3/uL (ref 4.0–10.5)
WBC: 5.4 10*3/uL (ref 4.0–10.5)
WBC: 6.8 10*3/uL (ref 4.0–10.5)

## 2014-02-20 LAB — TYPE AND SCREEN
ABO/RH(D): A POS
ANTIBODY SCREEN: NEGATIVE
Unit division: 0
Unit division: 0

## 2014-02-20 LAB — BASIC METABOLIC PANEL
ANION GAP: 13 (ref 5–15)
BUN: 32 mg/dL — ABNORMAL HIGH (ref 6–23)
CALCIUM: 8.2 mg/dL — AB (ref 8.4–10.5)
CO2: 22 meq/L (ref 19–32)
CREATININE: 1.94 mg/dL — AB (ref 0.50–1.10)
Chloride: 102 mEq/L (ref 96–112)
GFR, EST AFRICAN AMERICAN: 25 mL/min — AB (ref >90)
GFR, EST NON AFRICAN AMERICAN: 22 mL/min — AB (ref >90)
Glucose, Bld: 128 mg/dL — ABNORMAL HIGH (ref 70–99)
Potassium: 4.1 mEq/L (ref 3.7–5.3)
SODIUM: 137 meq/L (ref 137–147)

## 2014-02-20 LAB — LACTIC ACID, PLASMA: Lactic Acid, Venous: 1.1 mmol/L (ref 0.5–2.2)

## 2014-02-20 LAB — TSH: TSH: 3.48 u[IU]/mL (ref 0.350–4.500)

## 2014-02-20 MED ORDER — TECHNETIUM TC 99M DIETHYLENETRIAME-PENTAACETIC ACID
43.0000 | Freq: Once | INTRAVENOUS | Status: AC | PRN
  Administered 2014-02-20: 43 via RESPIRATORY_TRACT

## 2014-02-20 MED ORDER — TECHNETIUM TO 99M ALBUMIN AGGREGATED
6.0000 | Freq: Once | INTRAVENOUS | Status: AC | PRN
  Administered 2014-02-20: 6 via INTRAVENOUS

## 2014-02-20 MED ORDER — PANTOPRAZOLE SODIUM 40 MG IV SOLR
40.0000 mg | Freq: Two times a day (BID) | INTRAVENOUS | Status: DC
  Administered 2014-02-20 – 2014-02-21 (×3): 40 mg via INTRAVENOUS
  Filled 2014-02-20 (×3): qty 40

## 2014-02-20 NOTE — Care Management Note (Addendum)
    Page 1 of 1   02/21/2014     3:47:28 PM CARE MANAGEMENT NOTE 02/21/2014  Patient:  Dorothy Brooks, Dorothy Brooks   Account Number:  [id number]  Date Initiated:  02/20/2014  Documentation initiated by:  Jolene Provost  Subjective/Objective Assessment:   Pt is from home with strong family support.     Action/Plan:   Pt plans to return home at discharge. Will continue to follow.   Anticipated DC Date:  02/22/2014   Anticipated DC Plan:  [place]  CM consult      Choice offered to / List presented to:             Status of service:  Completed, signed off Medicare Important Message given?  YES (If response is "NO", the following Medicare IM given date fields will be blank) Date Medicare IM given:  02/21/2014 Medicare IM given by:  Jolene Provost Date Additional Medicare IM given:   Additional Medicare IM given by:    Discharge Disposition:  HOME/SELF CARE  Per UR Regulation:  Reviewed for med. necessity/level of care/duration of stay  If discussed at [place] of Stay Meetings, dates discussed:    Comments:  02/21/2014 [place], RN, MSN, PCCN Pt discharged home with self care. Pt refuses [place] serivces or PT. Pt says her grandaughter takes care of her. Pt has no CM needs at the time of dishcarge.  02/20/2014 [place], RN, MSN, [facility]

## 2014-02-20 NOTE — Progress Notes (Addendum)
[facility] PROGRESS NOTE  Dorothy Brooks SMO:[id number] DOB: 1922/06/06 DOA: 02/18/2014 PCP: Monico Blitz, MD    Code Status: DO NOT RESUSCITATE Family Communication: Discussed with granddaughter; son Mr. Laurance Flatten; and daughter-in-law. Disposition Plan: Discharge to when clinically appropriate.   Consultants:  GI Dr. Rourk>> Dr. Laural Golden  Procedures:  None  Antibiotics:  Cipro 12/14  Rocephin 12/13-12/14/2015.  HPI/Subjective: The patient looks better this morning. She is alert and oriented. She has no complaints of chest pain, shortness of breath, nausea, or vomiting. No evidence of hematemesis overnight. She what something TE.  Objective: Filed Vitals:   02/20/14 0730  BP:   Pulse:   Temp: 98 F (36.7 C)  Resp:   temperature 90.8. Heart rate 60. Respiratory rate 10. Blood pressure 119/41. Oxygen saturation 95% on  Supplemental oxygen.  Intake/Output Summary (Last 24 hours) at 02/20/14 9201 Last data filed at 02/20/14 0600  Gross per 24 hour  Intake 1922.5 ml  Output   1350 ml  Net  572.5 ml   Filed Weights   02/18/14 1655 02/18/14 2058 02/20/14 0500  Weight: 58.968 kg (130 lb) 57.4 kg (126 lb 8.7 oz) 62 kg (136 lb 11 oz)    Exam:   General:  Elderly 80 year old Caucasian woman sitting up in bed; much more alert, much more oriented, no acute distress.  Oropharynx: Bottom lip swelling resolved.. No posterior exudates or erythema.  Cardiovascular: S1, S2, with a 2 to 3/6 systolic murmur.  Respiratory: Lungs clear anteriorly with decreased breath sounds in the bases. No significant audible wheezes or crackles.  Abdomen: Positive bowel sounds, soft, nontender, nondistended.  Musculoskeletal: She has occasional jerky motions of her lower extremities. Pedal pulses palpable. No pedal edema.  Neurologic: She is much more alert and oriented to herself, granddaughter, and hospital. Cranial nerves II through XII appear to be grossly intact.  Data  Reviewed: Basic Metabolic Panel:  Recent Labs Lab 02/18/14 1720 02/19/14 0605 02/20/14 0206  NA 137 139 137  K 3.9 3.9 4.1  CL 95* 102 102  CO2 27 24 22   GLUCOSE 143* 124* 128*  BUN 29* 31* 32*  CREATININE 3.44* 2.98* 1.94*  CALCIUM 9.3 8.4 8.2*   Liver Function Tests:  Recent Labs Lab 02/18/14 1720 02/19/14 0605  AST 18 23  ALT 9 9  ALKPHOS 90 81  BILITOT 0.4 0.3  PROT 7.7 6.7  ALBUMIN 4.0 3.4*    Recent Labs Lab 02/18/14 1720  LIPASE 49   No results for input(s): AMMONIA in the last 168 hours. CBC:  Recent Labs Lab 02/18/14 1720 02/19/14 0605 02/19/14 1727 02/19/14 1948 02/20/14 0206 02/20/14 0825  WBC 7.6 7.4 6.0 4.9 4.8 5.4  NEUTROABS 4.7  --   --   --   --   --   HGB 8.2* 7.2* 10.2* 9.6* 9.6* 9.4*  HCT 27.4* 24.4* 32.1* 30.2* 29.8* 29.8*  MCV 75.3* 76.0* 78.7 78.2 78.0 78.2  PLT 304 271 202 184 184 185   Cardiac Enzymes: No results for input(s): CKTOTAL, CKMB, CKMBINDEX, TROPONINI in the last 168 hours. BNP (last 3 results) No results for input(s): PROBNP in the last 8760 hours. CBG: No results for input(s): GLUCAP in the last 168 hours.  Recent Results (from the past 240 hour(s))  MRSA PCR Screening     Status: None   Collection Time: 02/19/14  9:02 AM  Result Value Ref Range Status   MRSA by PCR NEGATIVE NEGATIVE Final    Comment:  The GeneXpert MRSA Assay (FDA approved for NASAL specimens only), is one component of a comprehensive MRSA colonization surveillance program. It is not intended to diagnose MRSA infection nor to guide or monitor treatment for MRSA infections.      Studies: Dg Chest 1 View  02/19/2014   CLINICAL DATA:  Confusion  EXAM: CHEST - 1 VIEW  COMPARISON:  03/21/2013  FINDINGS: Mild cardiac enlargement without heart failure. Mild bibasilar atelectasis. Hiatal hernia. Negative for pneumonia or effusion.  IMPRESSION: Mild bibasilar atelectasis.   Electronically Signed   By: Franchot Gallo M.D.   On:  02/19/2014 09:53   Ct Head Wo Contrast  02/19/2014   CLINICAL DATA:  Altered mental status, acute  EXAM: CT HEAD WITHOUT CONTRAST  TECHNIQUE: Contiguous axial images were obtained from the base of the skull through the vertex without intravenous contrast.  COMPARISON:  June 15, 2009  FINDINGS: There is age related volume loss. There is no intracranial mass, hemorrhage, extra-axial fluid collection, or midline shift. There is fairly extensive small vessel disease throughout the centra semiovale bilaterally, stable. There is also small vessel disease throughout the thalamic regions and both internal and external capsules, stable. There is no new gray-white compartment lesion. No acute infarct is appreciable on this examination. The bony calvarium appears intact. The mastoid air cells are clear.  IMPRESSION: Extensive supratentorial small vessel disease, stable. No intracranial mass, hemorrhage, or acute appearing infarct. Age related volume loss is stable.   Electronically Signed   By: Lowella Grip M.D.   On: 02/19/2014 10:30   Ct Chest Wo Contrast  02/19/2014   ADDENDUM REPORT: 02/19/2014 18:44  ADDENDUM: The findings section should read as the following  FINDINGS: 2.0 x 2.8 x 2.5 cm spiculated solid and cavitary and solid lesion in the anterior basal segment of the left lower lobe is worrisome for malignancy.  No obvious abnormal adenopathy. Three vessel coronary artery calcifications. Extensive atherosclerotic changes of the aortic arch.  Large hiatal hernia contains much of the stomach.  Reticulonodular opacities with a peripheral distribution up presentation at the lung bases hand in the right upper lobe. There is a 3 mm nodule at the left apex on image 15.  No destructive bone lesion.  1.8 cm hyperdense lesion emanating from the upper pole of the left kidney is larger. Right adrenal adenoma.  The impression should read as the following  IMPRESSION: Left lower lobe cavitary and solid lung mass  worrisome for malignancy. PET-CT is recommended.  Enlarging left renal hyperdense lesion worrisome for neoplasm.  Interstitial lung disease. Consider early UIP.   Electronically Signed   By: Maryclare Bean M.D.   On: 02/19/2014 18:44   02/19/2014   CLINICAL DATA:  Upper back pain.  Hypoxia.  EXAM: CT CHEST WITHOUT CONTRAST  TECHNIQUE: Multidetector CT imaging of the chest was performed following the standard protocol without IV contrast.  COMPARISON:  None.  FINDINGS: One wounds her mitral chronic ventricle encouraged cm spiculated solid and cavitary are round lesion in the anterior basal segment of the left lower lobe is worrisome for malignancy.  No obvious abnormal adenopathy. Three vessel coronary artery calcifications. Extensive atherosclerotic changes of the aortic arch.  Large hiatal hernia contains much of the stomach.  Reticulonodular opacities with a peripheral distribution up presentation at the lung bases hand in the right upper lobe. There is a 3 mm nodule at the left apex on image 15.  No destructive bone lesion.  1.8 cm hyperdense lesion emanating from  the upper pole of the left kidney is larger. Right adrenal adenoma.  IMPRESSION: Left lower lobe cavitary an solid line mass worrisome for malignancy. PET-CT is recommended.  Enlarging left renal hyperdense lesion worrisome for neoplasm.  Interstitial lung disease.  Consider early UIP.  Chronic changes.  Electronically Signed: By: Maryclare Bean M.D. On: 02/19/2014 18:28    Scheduled Meds: . ciprofloxacin  400 mg Intravenous Q24H  . citalopram  20 mg Oral Daily  . Influenza vac split quadrivalent PF  0.5 mL Intramuscular Tomorrow-1000  . levalbuterol  0.63 mg Nebulization Q8H  . rOPINIRole  1 mg Oral BID   Continuous Infusions: . sodium chloride 75 mL/hr at 02/20/14 0050  . pantoprozole (PROTONIX) infusion 8 mg/hr (02/20/14 0600)    Assessment and plan:  Principal Problem:   Upper GI bleed Active Problems:   Barrett's esophagus   Acute  blood loss anemia   Acute respiratory failure with hypoxia   PUD (peptic ulcer disease)   Acute renal failure   UTI (urinary tract infection)   Mass of lower lobe of left lung   Left kidney mass   Acute encephalopathy   Swollen lip   Myoclonic jerking   Confusion   1. Upper GI bleed. No active hematemesis currently. Discussed briefly with Dr. Laural Golden this morning who does not plan an EGD given no active hematemesis and in light of her other medical conditions being worked up. Etiology could've been a Mallory-Weiss tear, exacerbation of esophageal ulcer, gastritis, etc. We'll advance her diet to clear liquids. Will change Protonix infusion to every 12 hours IV. The patient has a history of Barrett's esophagitis and peptic ulcer disease. Per EGD by Dr. Laural Golden in January 2014, there was short segment of Barrett's esophagitis with a distal esophageal ulcer; focal gastritis at the level of the hiatus, and antral scar but no evidence of active peptic ulcer disease.  Acute blood loss anemia. Her hemoglobin was 8.2 on admission and had fallen to 7.2, which was likely secondary to equilibration from upper GI bleeding and hemodilution. She was transfused 2 units of packed red blood cells on 02/19/14 with improvement in her hemoglobin. We'll decrease CBCs to every 12 hours.   Acute respiratory failure with hypoxia and hypercapnia; mixed acidosis. Patient developed SOB with hypoxia yesterday. ABG on 2 L noted for a pH of 7.18 PCO2 of 63.3, and PO2 of 81. She was transferred to the ICU and placed on BiPAP which she tolerated for short while and then pulled it off. She was started on Xopenex nebulizer. Lactic acid this morning within normal limits at 1.1. Her CO2 on the BMET is within normal limits, so will not order another ABG. Clinically, she is much improved and will continue oxygen as tolerated. VQ scan ordered and pending. If positive, consider IVC filter. Would not anticoagulate her in the  setting of recurrent upper GI bleeding.  Left lower lobe lung mass. Noncontrasted CT of the chest ordered to rule out pneumonia or pulmonary edema. It revealed a 2.0 x 2.8 x 2.5 cm spiculated solid and cavitary lesion in the left lower lobe, likely malignancy. The family does not want invasive diagnostic workup or treatment such as chemotherapy or radiation therapy. However, they are in agreement with a VQ scan to see if she has a PE. Not sure if this was part of the cause of her hypoxia.  Left kidney lesion, per noncontrasted CT of the chest. We'll order an ultrasound to evaluate if it is cystic  or solid.  Swollen lower lip. Etiology is unclear, but she does have allergies to sulfa antibiotics which were not given.  She was given Rocephin earlier for the UTI, but it unclear if this is the culprit. However, we'll tentatively make Rocephin as an allergy. She is status post IV Benadryl and 1 dose of Solu-Medrol. Swelling has currently resolved.  UTI. Rocephin discontinued and started Cipro, renal dosing per pharmacy. Doesn't look like a urine culture was ordered on admission.  Acute kidney injury. This is presumed. I do not see a baseline creatinine, but in light of the GI bleed, this is likely prerenal azotemia. We'll continue IV fluids. Her creatinine is improving.  Essential hypertension. Blood pressure is low-normal, so will hold metoprolol and amlodipine.  Myoclonic jerking. Per granddaughter, the patient has chronic jerking, but it worsens when she has a UTI. Continue Requip. We'll continue to monitor and provide supportive treatment.  Acute encephalopathy. Although she has not been given a diagnosis, it appears that she may have mild dementia. Acute confusion likely secondary to hypoxia and/or UTI is resolving. CT of the head was nonacute.   Time spent: 45 minutes including discussion with family.Marland Kitchen    [facility] Pager [phone]. If 7PM-7AM,  please contact night-coverage at [email or web address], password [facility] 02/20/2014, 9:38 AM  LOS: 2 days

## 2014-02-20 NOTE — Care Management Utilization Note (Signed)
UR complete 

## 2014-02-20 NOTE — Progress Notes (Signed)
  Subjective:  Patient has no complaints. She had no difficulty with clear liquids. She denies chest pain or dysphagia nausea or vomiting. She has intermittent left anterior chest pain. Patient states that she is aware that she has chest CT findings consistent with lung carcinoma. She is hungry and wants to die to be advanced. Her granddaughter states that she has not been taking her PPI on regular basis.    Objective: Blood pressure 125/55, pulse 64, temperature 98.1 F (36.7 C), temperature source Oral, resp. rate 19, height 5\' 3"  (1.6 m), weight 136 lb 11 oz (62 kg), SpO2 93 %. Patient is alert and in no acute distress. Abdomen is flat soft and nontender without organomegaly or masses.  No LE edema or clubbing noted.  Labs/studies Results:   Recent Labs  02/19/14 1948 02/20/14 0206 02/20/14 0825  WBC 4.9 4.8 5.4  HGB 9.6* 9.6* 9.4*  HCT 30.2* 29.8* 29.8*  PLT 184 184 185    BMET   Recent Labs  02/18/14 1720 02/19/14 0605 02/20/14 0206  NA 137 139 137  K 3.9 3.9 4.1  CL 95* 102 102  CO2 27 24 22   GLUCOSE 143* 124* 128*  BUN 29* 31* 32*  CREATININE 3.44* 2.98* 1.94*  CALCIUM 9.3 8.4 8.2*    LFT   Recent Labs  02/18/14 1720 02/19/14 0605  PROT 7.7 6.7  ALBUMIN 4.0 3.4*  AST 18 23  ALT 9 9  ALKPHOS 90 81  BILITOT 0.4 0.3  BILIDIR  --  <0.2  IBILI  --  NOT CALCULATED    Assessment ;  #1. Upper GI bleed most likely secondary to esophagitis. She has known large hiatal hernia. She had EGD in January 2014 when she presented with nausea vomiting and iron deficiency anemia. #2. New diagnosis of left lung lesion suspected to be carcinoma.   Recommendations;  Advance diet. CBC in a.m. Will proceed with EGD if she has evidence of recurrent GI bleed or need for transfusion.

## 2014-02-21 LAB — BASIC METABOLIC PANEL
Anion gap: 9 (ref 5–15)
BUN: 25 mg/dL — ABNORMAL HIGH (ref 6–23)
CALCIUM: 8.4 mg/dL (ref 8.4–10.5)
CO2: 25 mEq/L (ref 19–32)
Chloride: 107 mEq/L (ref 96–112)
Creatinine, Ser: 1.46 mg/dL — ABNORMAL HIGH (ref 0.50–1.10)
GFR calc Af Amer: 36 mL/min — ABNORMAL LOW (ref >90)
GFR calc non Af Amer: 31 mL/min — ABNORMAL LOW (ref >90)
GLUCOSE: 93 mg/dL (ref 70–99)
Potassium: 4.1 mEq/L (ref 3.7–5.3)
Sodium: 141 mEq/L (ref 137–147)

## 2014-02-21 LAB — CBC
HCT: 34.4 % — ABNORMAL LOW (ref 36.0–46.0)
Hemoglobin: 10.5 g/dL — ABNORMAL LOW (ref 12.0–15.0)
MCH: 24.5 pg — ABNORMAL LOW (ref 26.0–34.0)
MCHC: 30.5 g/dL (ref 30.0–36.0)
MCV: 80.2 fL (ref 78.0–100.0)
PLATELETS: 216 10*3/uL (ref 150–400)
RBC: 4.29 MIL/uL (ref 3.87–5.11)
RDW: 17.7 % — AB (ref 11.5–15.5)
WBC: 7.3 10*3/uL (ref 4.0–10.5)

## 2014-02-21 MED ORDER — CIPROFLOXACIN HCL 250 MG PO TABS: 250.0000 mg | ORAL_TABLET | Freq: Two times a day (BID) | ORAL | Status: AC

## 2014-02-21 MED ORDER — OXYCODONE HCL 20 MG PO TABS: 20.0000 mg | ORAL_TABLET | ORAL | Status: AC | PRN

## 2014-02-21 NOTE — Progress Notes (Signed)
Patient ID: Dorothy Brooks, female   DOB: 07/12/1922, 80 y.o.   MRN: [id number] Feels better this am. Diet has been advanced to Heart healthy.  She denies nausea or vomiting. She is hungry. Sitting up in chair. Denies any abdominal pain. No BM since admission. No coffee ground emesis since admission. She has a drop in her hemoglobin.  Has received 2 units of PRBCs since admission. 03/30/2012 EGD:  Impression: Short segment Barrett's with small distal esophageal ulcer. Large sliding hiatal hernia with focal gastritis at the level of hiatus. Antral scar but no evidence of active PUD.   CBC Latest Ref Rng 02/20/2014 02/20/2014 02/20/2014  WBC 4.0 - 10.5 K/uL 6.8 5.4 4.8  Hemoglobin 12.0 - 15.0 g/dL 8.8(L) 9.4(L) 9.6(L)  Hematocrit 36.0 - 46.0 % 28.0(L) 29.8(L) 29.8(L)  Platelets 150 - 400 K/uL 185 185 184   Alert. Sitting up in chair. Lungs clear. Abdomen is soft. HR is regular.  Assessment: Upper GI bleed. CBC pending.

## 2014-02-21 NOTE — Discharge Summary (Signed)
Physician Discharge Summary  Dorothy Brooks ZOX:[id number] DOB: [birth year] DOA: 02/18/2014  PCP: Monico Blitz, MD  Admit date: 02/18/2014 Discharge date: 02/21/2014  Time spent: 65 minutes  Recommendations for Outpatient Follow-up:  -Will be discharged home today. -Advised to follow up with PCP in 1 week for follow up on renal function and hemoglobin. -[place] services were offered to patient, but she has refused.   Discharge Diagnoses:  Principal Problem:   Upper GI bleed Active Problems:   Barrett's esophagus   Acute renal failure   Acute blood loss anemia   Acute respiratory failure with hypoxia   Acute encephalopathy   PUD (peptic ulcer disease)   UTI (urinary tract infection)   Swollen lip   Myoclonic jerking   Confusion   Mass of lower lobe of left lung   Left kidney mass   Discharge Condition: Stable and improved  Filed Weights   02/18/14 2058 02/20/14 0500 02/21/14 0400  Weight: 57.4 kg (126 lb 8.7 oz) 62 kg (136 lb 11 oz) 63.2 kg (139 lb 5.3 oz)    History of present illness:  Dorothy Brooks is a 80 y.o. female  With a history of coronary artery disease, status post NSTEMI in 2006, parents esophagus, gastritis, anemia. She presents to the hospital with 3-4 days of hematemesis. This is been worsening over the past few days. The patient finally let her granddaughter, who is her care provider, take her to the hospital for evaluation. She has had several episodes of hematemesis requiring hospitalization and one that required blood transfusion due to hemoglobin of 7. The granddaughter reports that this occurred approximately 6-7 months ago at [facility] She currently has some mild abdominal pain.   Hospital Course:   Hematemesis -Has been evaluated by GI, Dr. Laural Golden; no plans for EGD unless active hematemesis (none while in the hospital). -Possible etiologies include: MW tear, PUD (h/o esophageal ulcer per EGD in 1/14). Has a large hiatal hernia and had not  been compliant with her PPI prior to admission. This has been discussed with her in detail.  Acute Blood Loss Anemia -Presumably from Upper GI Bleed and hemodilution. -Received 2 units of PRBCs on 12/14 with appropriate increase in Hb. -No indication for transfusion at present. -Hb is 10.5 on DC.  ARF -Cr was 3.44 on admission down to 1.4 on DC with IVF. -Should have follow up with PCP in 1 week for recheck on renal function.  Left Lower Lobe Lung Mass -As per CT chest. -Likely is a malignancy. -Patient and family are aware and are requesting conservative management; they do not want diagnostic workup or treatment.  UTI -Rocephin was transitioned to cipro over concerns for a potential allergy (swollen lower lip). -Would continue cipro for 7 days total (5 remaining on DC). -Urine cx was not ordered on admission.  Swollen Lower Lip -Resolved. -??allergic reaction to rocephin. -Was given benadryl and solumedrol with good response.  Acute Respiratory Failure with Hypoxemia -On 12/14 had a short-lived episode of SOB with oxygen sats at 88%. -VQ scan performed that was low-prob for PE. -Was on Bipap transiently, but is now on RA with good sats and no increased work of breathing. -Not acidotic ber BMET today (CO2 is 25).  Renal Cyst -65mm upper pole of left kidney per [place]. -Follow up US is recommended in 6 months to demonstrate stability.  Mild Acute Encephalopathy -On exam today, patient is lucid and able to respond appropriately to all questions. -She has multiple reasons  for confusion: UTI, transient hypoxemia, newly diagnosed lung mass (potentially malignant), high doses of narcotics prescribed as an OP, ARF (markedly improved).  -She may also have a component of mild dementia related to her age. -Given her age and potential lung cancer, I have concerns that she will begin to fail to thrive. I have discussed this with daughter-in-law via phone. -Patient is not interested in [facility]  or hospice services.  Procedures:  None   Consultations:  GI, Dr. Laural Golden  Discharge Instructions  Discharge Instructions    Increase activity slowly    Complete by:  As directed             Medication List    TAKE these medications        ALPRAZolam 0.5 MG tablet  Commonly known as:  XANAX  Take 0.25-0.5 mg by mouth at bedtime as needed for anxiety or sleep.     amLODipine 10 MG tablet  Commonly known as:  NORVASC  Take 10 mg by mouth daily.     ciprofloxacin 250 MG tablet  Commonly known as:  CIPRO  Take 1 tablet (250 mg total) by mouth 2 (two) times daily.     citalopram 40 MG tablet  Commonly known as:  CELEXA  Take 40 mg by mouth daily.     docusate sodium 100 MG capsule  Commonly known as:  COLACE  Take 100 mg by mouth 2 (two) times daily as needed for constipation.     ipratropium-albuterol 0.5-2.5 (3) MG/3ML Soln  Commonly known as:  DUONEB  Take 3 mLs by nebulization every 4 (four) hours as needed (for shortness of breath/wheezing).     iron polysaccharides 150 MG capsule  Commonly known as:  NIFEREX  Take 150 mg by mouth daily.     metoprolol succinate 25 MG 24 hr tablet  Commonly known as:  TOPROL XL  Take 1 tablet (25 mg total) by mouth every morning.     nitroGLYCERIN 0.4 MG SL tablet  Commonly known as:  NITROSTAT  Place 1 tablet (0.4 mg total) under the tongue every 5 (five) minutes as needed for chest pain.     Oxycodone HCl 20 MG Tabs  Take 20 mg by mouth every 4 (four) hours as needed (for pain).     pantoprazole 40 MG tablet  Commonly known as:  PROTONIX  Take 40 mg by mouth daily.     rOPINIRole 1 MG tablet  Commonly known as:  REQUIP  Take 1 mg by mouth 2 (two) times daily.       Allergies  Allergen Reactions  . Rocephin [Ceftriaxone Sodium In Dextrose] Swelling    Presumed etiology of lip swelling.  . Aspirin Other (See Comments)    Cannot take due to Hiatal Hernia  . Sulfa Antibiotics Swelling  . Talwin [Pentazocine]  Nausea And Vomiting    Deathly to patient.       The results of significant diagnostics from this hospitalization (including imaging, microbiology, ancillary and laboratory) are listed below for reference.    Significant Diagnostic Studies: Dg Chest 1 View  02/19/2014   CLINICAL DATA:  Confusion  EXAM: CHEST - 1 VIEW  COMPARISON:  03/21/2013  FINDINGS: Mild cardiac enlargement without heart failure. Mild bibasilar atelectasis. Hiatal hernia. Negative for pneumonia or effusion.  IMPRESSION: Mild bibasilar atelectasis.   Electronically Signed   By: Franchot Gallo M.D.   On: 02/19/2014 09:53   Ct Head Wo Contrast  02/19/2014   CLINICAL DATA:  Altered mental status, acute  EXAM: CT HEAD WITHOUT CONTRAST  TECHNIQUE: Contiguous axial images were obtained from the base of the skull through the vertex without intravenous contrast.  COMPARISON:  June 15, 2009  FINDINGS: There is age related volume loss. There is no intracranial mass, hemorrhage, extra-axial fluid collection, or midline shift. There is fairly extensive small vessel disease throughout the centra semiovale bilaterally, stable. There is also small vessel disease throughout the thalamic regions and both internal and external capsules, stable. There is no new gray-white compartment lesion. No acute infarct is appreciable on this examination. The bony calvarium appears intact. The mastoid air cells are clear.  IMPRESSION: Extensive supratentorial small vessel disease, stable. No intracranial mass, hemorrhage, or acute appearing infarct. Age related volume loss is stable.   Electronically Signed   By: Lowella Grip M.D.   On: 02/19/2014 10:30   Ct Chest Wo Contrast  02/19/2014   ADDENDUM REPORT: 02/19/2014 18:44  ADDENDUM: The findings section should read as the following  FINDINGS: 2.0 x 2.8 x 2.5 cm spiculated solid and cavitary and solid lesion in the anterior basal segment of the left lower lobe is worrisome for malignancy.  No obvious  abnormal adenopathy. Three vessel coronary artery calcifications. Extensive atherosclerotic changes of the aortic arch.  Large hiatal hernia contains much of the stomach.  Reticulonodular opacities with a peripheral distribution up presentation at the lung bases hand in the right upper lobe. There is a 3 mm nodule at the left apex on image 15.  No destructive bone lesion.  1.8 cm hyperdense lesion emanating from the upper pole of the left kidney is larger. Right adrenal adenoma.  The impression should read as the following  IMPRESSION: Left lower lobe cavitary and solid lung mass worrisome for malignancy. PET-CT is recommended.  Enlarging left renal hyperdense lesion worrisome for neoplasm.  Interstitial lung disease. Consider early UIP.   Electronically Signed   By: Maryclare Bean M.D.   On: 02/19/2014 18:44   02/19/2014   CLINICAL DATA:  Upper back pain.  Hypoxia.  EXAM: CT CHEST WITHOUT CONTRAST  TECHNIQUE: Multidetector CT imaging of the chest was performed following the standard protocol without IV contrast.  COMPARISON:  None.  FINDINGS: One wounds her mitral chronic ventricle encouraged cm spiculated solid and cavitary are round lesion in the anterior basal segment of the left lower lobe is worrisome for malignancy.  No obvious abnormal adenopathy. Three vessel coronary artery calcifications. Extensive atherosclerotic changes of the aortic arch.  Large hiatal hernia contains much of the stomach.  Reticulonodular opacities with a peripheral distribution up presentation at the lung bases hand in the right upper lobe. There is a 3 mm nodule at the left apex on image 15.  No destructive bone lesion.  1.8 cm hyperdense lesion emanating from the upper pole of the left kidney is larger. Right adrenal adenoma.  IMPRESSION: Left lower lobe cavitary an solid line mass worrisome for malignancy. PET-CT is recommended.  Enlarging left renal hyperdense lesion worrisome for neoplasm.  Interstitial lung disease.  Consider early  UIP.  Chronic changes.  Electronically Signed: By: Maryclare Bean M.D. On: 02/19/2014 18:28   US Renal  02/20/2014   CLINICAL DATA:  LEFT renal mass  EXAM: RENAL/URINARY TRACT ULTRASOUND COMPLETE  COMPARISON:  CT chest 02/19/2014 ; prior ultrasound of 10/13/2010 not available in PACs for comparison  FINDINGS: Right Kidney:  Length: 11.4 cm. Diffuse cortical thinning. Normal cortical echogenicity. No mass, hydronephrosis or shadowing calcification.  Left Kidney:  Length: 10.0 cm. Diffuse cortical thinning. Normal cortical echogenicity. Hypoechoic nodule at upper pole, exophytic, 15 x 12 x 16 mm, containing a few scattered internal echoes, consistent with a minimally complicated cyst. No additional mass, hydronephrosis or shadowing calcification.  Bladder:  Decompressed by Foley catheter, unable to assess.  IMPRESSION: Age-related renal cortical atrophy bilaterally.  Minimally complicated cyst at upper pole of LEFT kidney, 16 mm greatest size, containing a few scattered internal echoes.  Followup ultrasound recommended in 6 months to demonstrate stability.   Electronically Signed   By: Lavonia Dana M.D.   On: 02/20/2014 09:51   Nm Pulmonary Perf And Vent  02/20/2014   CLINICAL DATA:  Hypoxia, history uterine cancer, gastric ulcer, hypertension, coronary artery disease  EXAM: NUCLEAR MEDICINE VENTILATION - PERFUSION LUNG SCAN  TECHNIQUE: Ventilation images were obtained in multiple projections using inhaled aerosol technetium-7m DTPA. Perfusion images were obtained in multiple projections after intravenous injection of Tc-78m MAA.  RADIOPHARMACEUTICALS:  43 mCi Tc-10m DTPA aerosol and 6 mCi Tc-43m MAA  COMPARISON:  None; correlation chest CT and chest radiograph of 02/19/2014  FINDINGS: Ventilation: Peripheral small subsegmental ventilatory defects in both lungs greater in upper lobes.  Perfusion: Few small subsegmental perfusion defects identified in LEFT lung, smaller than the observed ventilatory findings. No  additional segmental or subsegmental perfusion defects identified.  Chest radiograph/CT significant for enlargement of cardiac silhouette, tortuosity of thoracic aorta, hiatal hernia, and bibasilar atelectasis, with note of a cavitary nodule in the LEFT lower lobe.  IMPRESSION: Very low probability for pulmonary embolism.   Electronically Signed   By: Lavonia Dana M.D.   On: 02/20/2014 11:39    Microbiology: Recent Results (from the past 240 hour(s))  MRSA PCR Screening     Status: None   Collection Time: 02/19/14  9:02 AM  Result Value Ref Range Status   MRSA by PCR NEGATIVE NEGATIVE Final    Comment:        The GeneXpert MRSA Assay (FDA approved for NASAL specimens only), is one component of a comprehensive MRSA colonization surveillance program. It is not intended to diagnose MRSA infection nor to guide or monitor treatment for MRSA infections.      Labs: Basic Metabolic Panel:  Recent Labs Lab 02/18/14 1720 02/19/14 0605 02/20/14 0206 02/21/14 0525  NA 137 139 137 141  K 3.9 3.9 4.1 4.1  CL 95* 102 102 107  CO2 27 24 22 25   GLUCOSE 143* 124* 128* 93  BUN 29* 31* 32* 25*  CREATININE 3.44* 2.98* 1.94* 1.46*  CALCIUM 9.3 8.4 8.2* 8.4   Liver Function Tests:  Recent Labs Lab 02/18/14 1720 02/19/14 0605  AST 18 23  ALT 9 9  ALKPHOS 90 81  BILITOT 0.4 0.3  PROT 7.7 6.7  ALBUMIN 4.0 3.4*    Recent Labs Lab 02/18/14 1720  LIPASE 49   No results for input(s): AMMONIA in the last 168 hours. CBC:  Recent Labs Lab 02/18/14 1720  02/19/14 1948 02/20/14 0206 02/20/14 0825 02/20/14 1915 02/21/14 0814  WBC 7.6  < > 4.9 4.8 5.4 6.8 7.3  NEUTROABS 4.7  --   --   --   --   --   --   HGB 8.2*  < > 9.6* 9.6* 9.4* 8.8* 10.5*  HCT 27.4*  < > 30.2* 29.8* 29.8* 28.0* 34.4*  MCV 75.3*  < > 78.2 78.0 78.2 79.1 80.2  PLT 304  < > 184 184 185 185 216  < > =  values in this interval not displayed. Cardiac Enzymes: No results for input(s): CKTOTAL, CKMB, CKMBINDEX,  TROPONINI in the last 168 hours. BNP: BNP (last 3 results) No results for input(s): PROBNP in the last 8760 hours. CBG: No results for input(s): GLUCAP in the last 168 hours.     SignedLelon Frohlich  [facility] Pager: [phone] 02/21/2014, 11:25 AM

## 2014-02-21 NOTE — Progress Notes (Signed)
Patient ID: Dorothy Brooks, female   DOB: 07/09/1922, 80 y.o.   MRN: [id number] CBC has remained stable.

## 2014-02-21 NOTE — Progress Notes (Signed)
Pt a/o.vss. Resting in chair. Family at bedside. Saline lock removed. Discharge instructions and prescriptions given and discussed with family. Pt left floor via wheelchair accompanied with family and nursing staff. Discharged home.

## 2014-02-21 NOTE — Progress Notes (Signed)
Patient medically cleared to go home, but pt's daughter-in-law does not want her discharged. Patient and family refuse to consider rehab placement. Patient has refused to have PT come to home. Dr Jerilee Hoh and Jolene Provost RN Case Manager have spoken to patient and family members. Daughter in law stated she was going to put patient back in bed after lunch, and I reminded her that only staff should be moving her while she is in the hospital. Patient is high fall risk. Daughter in law expressed understanding.

## 2014-02-21 NOTE — Progress Notes (Signed)
Daughter in law at bedside attempting to get patient out of bed to the chair without assistance. Education provided that a staff member needed to be present to assist with any attempts of getting out of the bed due to high fall risk and monitor equipment attached to the patient. Daughter in law verbalized understanding.

## 2014-05-16 ENCOUNTER — Ambulatory Visit: Payer: Medicare Other | Admitting: Cardiovascular Disease

## 2014-05-25 ENCOUNTER — Telehealth (INDEPENDENT_AMBULATORY_CARE_PROVIDER_SITE_OTHER): Payer: Self-pay | Admitting: *Deleted

## 2014-05-25 NOTE — Telephone Encounter (Signed)
Dorothy Brooks, stating he is the son, would like to speak with someone about his mother. The return phone number is [phone]. His name is not listed as the emergency contact person and we do not have a DPR on file for her.

## 2014-05-28 NOTE — Telephone Encounter (Signed)
Patient states he did not call our office. He was trying to reach a pulmonologist.

## 2014-05-28 NOTE — Telephone Encounter (Signed)
Mr. Laurance Flatten called the office again wanting to speak with his mother's doctor. Advised him we are not his mothers cancer doctor. "I am sorry, I have the wrong number."

## 2015-07-08 DEATH — deceased

## 2016-10-05 IMAGING — NM NM MYOCAR MULTI W/SPECT W/WALL MOTION & EF
2 series · 12 of 12 positions shown · non-contrast
Comparison: None.

CLINICAL DATA: 89-year-old female with a known history of coronary
artery disease referred for chest pain.

EXAM:
MYOCARDIAL IMAGING WITH SPECT (REST AND PHARMACOLOGIC-STRESS)
GATED LEFT VENTRICULAR WALL MOTION STUDY
LEFT VENTRICULAR EJECTION FRACTION
TECHNIQUE: Standard myocardial SPECT imaging was performed after resting
intravenous injection of 10 mCi Rc-99m sestamibi. Subsequently,
intravenous infusion of Lexiscan was performed under the supervision
of the Cardiology staff. At peak effect of the drug, 30 mCi Rc-99m
sestamibi was injected intravenously and standard myocardial SPECT
imaging was performed. Quantitative gated imaging was also performed
to evaluate left ventricular wall motion, and estimate left
ventricular ejection fraction.

[Series 1: rest · 8.28mm/px · 6 of 64 frames shown]
[frame 6/64]
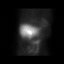
[frame 16/64]
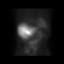
[frame 27/64]
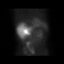
[frame 38/64]
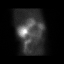
[frame 48/64]
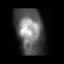
[frame 59/64]
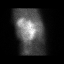

[Series 2: stress gated · 8.28mm/px · 6 of 64 frames shown]
[frame 6/64]
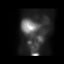
[frame 16/64]
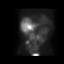
[frame 27/64]
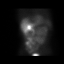
[frame 38/64]
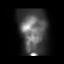
[frame 48/64]
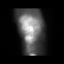
[frame 59/64]
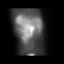

[12 of 12 positions shown; findings below may reference images not displayed]

FINDINGS: Pharmacological stress

Baseline EKG shows sinus rhythm with occasional PVCs. After
injection heart rate increased from 68 beats per min up to 86 beats
per min, and blood pressure decreased from 152/66 down to 130/68.
The test was stopped after injection was complete, the patient did
not experience any chest pain. Post injection EKG showed no specific
ischemic changes and no significant arrhythmias. There were some
occasional PVCs noted.

Perfusion: There is a moderate-sized mild to moderate intensity
inferoseptal wall defect seen in the resting images. A similar less
intense defect is seen in the post-injection images. The
inferoseptal wall has normal wall motion. Finding most consistent
with sub- diaphragmatic attenuation, there is also some noted
radiotracer uptake in the adjacent gut to the inferoseptal wall
likely contributing to the defect.

Wall Motion: Normal left ventricular wall motion. No left
ventricular dilation.

Left Ventricular Ejection Fraction: 73 %

End diastolic volume 77 ml

End systolic volume 21 ml
IMPRESSION: 1. No reversible ischemia or infarction.

2. Normal left ventricular wall motion.

3. Left ventricular ejection fraction 73%

4. Low-risk stress test findings*.

*6206 Appropriate Use Criteria for Coronary Revascularization
Focused Update: J Am Coll Cardiol. 6206;59(9):857-881.
[URL]
